# Patient Record
Sex: Male | Born: 1955 | Race: White | Hispanic: No | Marital: Married | Smoking: Never smoker
Health system: Southern US, Community
[De-identification: ages and names within clinical notes are randomized; demographics above are authoritative.]

## PROBLEM LIST (undated history)

## (undated) DIAGNOSIS — E785 Hyperlipidemia, unspecified: Secondary | ICD-10-CM

## (undated) DIAGNOSIS — S42409A Unspecified fracture of lower end of unspecified humerus, initial encounter for closed fracture: Secondary | ICD-10-CM

## (undated) HISTORY — DX: Hyperlipidemia, unspecified: E78.5

## (undated) HISTORY — PX: TONSILLECTOMY: SUR1361

---

## 2005-03-07 ENCOUNTER — Encounter: Admission: RE | Admit: 2005-03-07 | Discharge: 2005-03-07 | Payer: Self-pay | Admitting: Internal Medicine

## 2005-04-23 ENCOUNTER — Encounter (INDEPENDENT_AMBULATORY_CARE_PROVIDER_SITE_OTHER): Payer: Self-pay | Admitting: Specialist

## 2005-04-23 ENCOUNTER — Ambulatory Visit (HOSPITAL_COMMUNITY): Admission: RE | Admit: 2005-04-23 | Discharge: 2005-04-23 | Payer: Self-pay | Admitting: *Deleted

## 2009-10-23 ENCOUNTER — Ambulatory Visit: Payer: Self-pay | Admitting: Hematology and Oncology

## 2009-10-24 ENCOUNTER — Ambulatory Visit (HOSPITAL_COMMUNITY): Admission: RE | Admit: 2009-10-24 | Discharge: 2009-10-24 | Payer: Self-pay | Admitting: Hematology and Oncology

## 2009-10-24 LAB — CBC & DIFF AND RETIC
BASO%: 0.5 % (ref 0.0–2.0)
Basophils Absolute: 0 10*3/uL (ref 0.0–0.1)
EOS%: 1.2 % (ref 0.0–7.0)
Eosinophils Absolute: 0.1 10*3/uL (ref 0.0–0.5)
HCT: 19.7 % — ABNORMAL LOW (ref 38.4–49.9)
HGB: 7 g/dL — ABNORMAL LOW (ref 13.0–17.1)
Immature Retic Fract: 6.9 % (ref 0.00–13.40)
LYMPH%: 32.8 % (ref 14.0–49.0)
MCH: 34.3 pg — ABNORMAL HIGH (ref 27.2–33.4)
MCHC: 35.5 g/dL (ref 32.0–36.0)
MCV: 96.6 fL (ref 79.3–98.0)
MONO#: 0.4 10*3/uL (ref 0.1–0.9)
MONO%: 7.1 % (ref 0.0–14.0)
NEUT#: 3.5 10*3/uL (ref 1.5–6.5)
NEUT%: 58.4 % (ref 39.0–75.0)
Platelets: 300 10*3/uL (ref 140–400)
RBC: 2.04 10*6/uL — ABNORMAL LOW (ref 4.20–5.82)
RDW: 18.9 % — ABNORMAL HIGH (ref 11.0–14.6)
Retic %: 2.16 % — ABNORMAL HIGH (ref 0.50–1.60)
Retic Ct Abs: 44.06 10*3/uL (ref 24.10–77.50)
WBC: 6.1 10*3/uL (ref 4.0–10.3)
lymph#: 2 10*3/uL (ref 0.9–3.3)

## 2009-10-24 LAB — COMPREHENSIVE METABOLIC PANEL
ALT: 24 U/L (ref 0–53)
AST: 25 U/L (ref 0–37)
Albumin: 3.8 g/dL (ref 3.5–5.2)
Alkaline Phosphatase: 59 U/L (ref 39–117)
BUN: 12 mg/dL (ref 6–23)
CO2: 28 mEq/L (ref 19–32)
Calcium: 9.2 mg/dL (ref 8.4–10.5)
Chloride: 106 mEq/L (ref 96–112)
Creatinine, Ser: 0.92 mg/dL (ref 0.40–1.50)
Glucose, Bld: 128 mg/dL — ABNORMAL HIGH (ref 70–99)
Potassium: 4.2 mEq/L (ref 3.5–5.3)
Sodium: 139 mEq/L (ref 135–145)
Total Bilirubin: 1.2 mg/dL (ref 0.3–1.2)
Total Protein: 6.8 g/dL (ref 6.0–8.3)

## 2009-10-24 LAB — URINALYSIS, MICROSCOPIC - CHCC
Bilirubin (Urine): NEGATIVE
Glucose: NEGATIVE g/dL
Ketones: NEGATIVE mg/dL
Leukocyte Esterase: NEGATIVE
Nitrite: NEGATIVE
Protein: NEGATIVE mg/dL
Specific Gravity, Urine: 1.01 (ref 1.003–1.035)
WBC, UA: NEGATIVE (ref 0–2)
pH: 6.5 (ref 4.6–8.0)

## 2009-10-24 LAB — MORPHOLOGY: PLT EST: ADEQUATE

## 2009-10-24 LAB — LACTATE DEHYDROGENASE: LDH: 149 U/L (ref 94–250)

## 2009-10-25 ENCOUNTER — Other Ambulatory Visit: Admission: RE | Admit: 2009-10-25 | Discharge: 2009-10-25 | Payer: Self-pay | Admitting: Hematology and Oncology

## 2009-10-25 LAB — TYPE & CROSSMATCH - CHCC

## 2009-10-25 LAB — HIV ANTIBODY (ROUTINE TESTING W REFLEX)

## 2009-10-27 LAB — IRON AND TIBC
Iron: 238 ug/dL — ABNORMAL HIGH (ref 42–165)
UIBC: <55 ug/dL

## 2009-10-27 LAB — PROTEIN ELECTROPHORESIS, SERUM, WITH REFLEX
Albumin ELP: 60.8 % (ref 55.8–66.1)
Alpha-1-Globulin: 7.5 % — ABNORMAL HIGH (ref 2.9–4.9)
Alpha-2-Globulin: 9.3 % (ref 7.1–11.8)
Beta 2: 3.4 % (ref 3.2–6.5)
Beta Globulin: 5.3 % (ref 4.7–7.2)
Gamma Globulin: 13.7 % (ref 11.1–18.8)
M-Spike, %: 0.48 g/dL
Total Protein, Serum Electrophoresis: 6.5 g/dL (ref 6.0–8.3)

## 2009-10-27 LAB — FERRITIN: Ferritin: 237 ng/mL (ref 22–322)

## 2009-10-27 LAB — DIRECT ANTIGLOBULIN TEST (NOT AT ARMC)
DAT (Complement): NEGATIVE
DAT IgG: NEGATIVE

## 2009-10-27 LAB — IGG, IGA, IGM
IgA: 112 mg/dL (ref 68–378)
IgG (Immunoglobin G), Serum: 891 mg/dL (ref 694–1618)
IgM, Serum: 132 mg/dL (ref 60–263)

## 2009-10-27 LAB — IFE INTERPRETATION

## 2009-10-27 LAB — HAPTOGLOBIN: Haptoglobin: 135 mg/dL (ref 16–200)

## 2009-10-31 ENCOUNTER — Ambulatory Visit (HOSPITAL_COMMUNITY): Admission: RE | Admit: 2009-10-31 | Discharge: 2009-10-31 | Payer: Self-pay | Admitting: Hematology and Oncology

## 2009-11-01 LAB — CBC WITH DIFFERENTIAL/PLATELET
BASO%: 0.6 % (ref 0.0–2.0)
Basophils Absolute: 0 10*3/uL (ref 0.0–0.1)
EOS%: 0.7 % (ref 0.0–7.0)
Eosinophils Absolute: 0 10*3/uL (ref 0.0–0.5)
HCT: 24.6 % — ABNORMAL LOW (ref 38.4–49.9)
HGB: 8.8 g/dL — ABNORMAL LOW (ref 13.0–17.1)
LYMPH%: 38 % (ref 14.0–49.0)
MCH: 34 pg — ABNORMAL HIGH (ref 27.2–33.4)
MCHC: 35.7 g/dL (ref 32.0–36.0)
MCV: 95.3 fL (ref 79.3–98.0)
MONO#: 0.4 10*3/uL (ref 0.1–0.9)
MONO%: 8.2 % (ref 0.0–14.0)
NEUT#: 2.8 10*3/uL (ref 1.5–6.5)
NEUT%: 52.5 % (ref 39.0–75.0)
Platelets: 240 10*3/uL (ref 140–400)
RBC: 2.58 10*6/uL — ABNORMAL LOW (ref 4.20–5.82)
RDW: 21.9 % — ABNORMAL HIGH (ref 11.0–14.6)
WBC: 5.3 10*3/uL (ref 4.0–10.3)
lymph#: 2 10*3/uL (ref 0.9–3.3)

## 2009-11-08 LAB — CBC WITH DIFFERENTIAL/PLATELET
BASO%: 0.8 % (ref 0.0–2.0)
Basophils Absolute: 0.1 10*3/uL (ref 0.0–0.1)
EOS%: 0.5 % (ref 0.0–7.0)
Eosinophils Absolute: 0 10*3/uL (ref 0.0–0.5)
HCT: 23 % — ABNORMAL LOW (ref 38.4–49.9)
HGB: 8.2 g/dL — ABNORMAL LOW (ref 13.0–17.1)
LYMPH%: 35.1 % (ref 14.0–49.0)
MCH: 33.7 pg — ABNORMAL HIGH (ref 27.2–33.4)
MCHC: 35.5 g/dL (ref 32.0–36.0)
MCV: 94.9 fL (ref 79.3–98.0)
MONO#: 0.5 10*3/uL (ref 0.1–0.9)
MONO%: 7.5 % (ref 0.0–14.0)
NEUT#: 3.7 10*3/uL (ref 1.5–6.5)
NEUT%: 56.1 % (ref 39.0–75.0)
Platelets: 323 10*3/uL (ref 140–400)
RBC: 2.43 10*6/uL — ABNORMAL LOW (ref 4.20–5.82)
RDW: 21.9 % — ABNORMAL HIGH (ref 11.0–14.6)
WBC: 6.5 10*3/uL (ref 4.0–10.3)
lymph#: 2.3 10*3/uL (ref 0.9–3.3)

## 2009-11-09 ENCOUNTER — Encounter (HOSPITAL_COMMUNITY): Admission: RE | Admit: 2009-11-09 | Discharge: 2010-01-29 | Payer: Self-pay | Admitting: Hematology and Oncology

## 2009-11-09 LAB — TYPE & CROSSMATCH - CHCC

## 2009-11-19 LAB — CBC WITH DIFFERENTIAL/PLATELET
BASO%: 0.6 % (ref 0.0–2.0)
Basophils Absolute: 0 10*3/uL (ref 0.0–0.1)
EOS%: 0.9 % (ref 0.0–7.0)
Eosinophils Absolute: 0 10*3/uL (ref 0.0–0.5)
HCT: 25.5 % — ABNORMAL LOW (ref 38.4–49.9)
HGB: 8.9 g/dL — ABNORMAL LOW (ref 13.0–17.1)
LYMPH%: 35.2 % (ref 14.0–49.0)
MCH: 32.5 pg (ref 27.2–33.4)
MCHC: 35.1 g/dL (ref 32.0–36.0)
MCV: 92.5 fL (ref 79.3–98.0)
MONO#: 0.5 10*3/uL (ref 0.1–0.9)
MONO%: 9.2 % (ref 0.0–14.0)
NEUT#: 2.8 10*3/uL (ref 1.5–6.5)
NEUT%: 54.1 % (ref 39.0–75.0)
Platelets: 260 10*3/uL (ref 140–400)
RBC: 2.75 10*6/uL — ABNORMAL LOW (ref 4.20–5.82)
RDW: 19.2 % — ABNORMAL HIGH (ref 11.0–14.6)
WBC: 5.3 10*3/uL (ref 4.0–10.3)
lymph#: 1.8 10*3/uL (ref 0.9–3.3)

## 2009-11-19 LAB — COMPREHENSIVE METABOLIC PANEL
ALT: 26 U/L (ref 0–53)
AST: 22 U/L (ref 0–37)
Albumin: 4.2 g/dL (ref 3.5–5.2)
Alkaline Phosphatase: 67 U/L (ref 39–117)
BUN: 13 mg/dL (ref 6–23)
CO2: 25 mEq/L (ref 19–32)
Calcium: 9.1 mg/dL (ref 8.4–10.5)
Chloride: 106 mEq/L (ref 96–112)
Creatinine, Ser: 0.95 mg/dL (ref 0.40–1.50)
Glucose, Bld: 97 mg/dL (ref 70–99)
Potassium: 4.5 mEq/L (ref 3.5–5.3)
Sodium: 140 mEq/L (ref 135–145)
Total Bilirubin: 0.7 mg/dL (ref 0.3–1.2)
Total Protein: 6.7 g/dL (ref 6.0–8.3)

## 2009-11-20 LAB — TYPE & CROSSMATCH - CHCC

## 2009-11-23 ENCOUNTER — Ambulatory Visit: Payer: Self-pay | Admitting: Hematology and Oncology

## 2009-11-23 LAB — CBC WITH DIFFERENTIAL/PLATELET
BASO%: 0.8 % (ref 0.0–2.0)
Basophils Absolute: 0 10*3/uL (ref 0.0–0.1)
EOS%: 5.4 % (ref 0.0–7.0)
Eosinophils Absolute: 0.2 10*3/uL (ref 0.0–0.5)
HCT: 32 % — ABNORMAL LOW (ref 38.4–49.9)
HGB: 11.2 g/dL — ABNORMAL LOW (ref 13.0–17.1)
LYMPH%: 32.6 % (ref 14.0–49.0)
MCH: 30.9 pg (ref 27.2–33.4)
MCHC: 35 g/dL (ref 32.0–36.0)
MCV: 88.4 fL (ref 79.3–98.0)
MONO#: 0.9 10*3/uL (ref 0.1–0.9)
MONO%: 23.7 % — ABNORMAL HIGH (ref 0.0–14.0)
NEUT#: 1.4 10*3/uL — ABNORMAL LOW (ref 1.5–6.5)
NEUT%: 37.5 % — ABNORMAL LOW (ref 39.0–75.0)
Platelets: 120 10*3/uL — ABNORMAL LOW (ref 140–400)
RBC: 3.62 10*6/uL — ABNORMAL LOW (ref 4.20–5.82)
RDW: 16.7 % — ABNORMAL HIGH (ref 11.0–14.6)
WBC: 3.7 10*3/uL — ABNORMAL LOW (ref 4.0–10.3)
lymph#: 1.2 10*3/uL (ref 0.9–3.3)
nRBC: 0 % (ref 0–0)

## 2009-11-23 LAB — BASIC METABOLIC PANEL
BUN: 16 mg/dL (ref 6–23)
CO2: 28 mEq/L (ref 19–32)
Calcium: 8.9 mg/dL (ref 8.4–10.5)
Chloride: 105 mEq/L (ref 96–112)
Creatinine, Ser: 1 mg/dL (ref 0.40–1.50)
Glucose, Bld: 84 mg/dL (ref 70–99)
Potassium: 4.1 mEq/L (ref 3.5–5.3)
Sodium: 138 mEq/L (ref 135–145)

## 2009-11-23 LAB — LACTATE DEHYDROGENASE: LDH: 180 U/L (ref 94–250)

## 2009-11-27 LAB — CBC WITH DIFFERENTIAL/PLATELET
BASO%: 0.2 % (ref 0.0–2.0)
Basophils Absolute: 0 10*3/uL (ref 0.0–0.1)
EOS%: 2 % (ref 0.0–7.0)
Eosinophils Absolute: 0.1 10*3/uL (ref 0.0–0.5)
HCT: 27.4 % — ABNORMAL LOW (ref 38.4–49.9)
HGB: 10 g/dL — ABNORMAL LOW (ref 13.0–17.1)
LYMPH%: 23.7 % (ref 14.0–49.0)
MCH: 32.6 pg (ref 27.2–33.4)
MCHC: 36.4 g/dL — ABNORMAL HIGH (ref 32.0–36.0)
MCV: 89.6 fL (ref 79.3–98.0)
MONO#: 0.3 10*3/uL (ref 0.1–0.9)
MONO%: 6.8 % (ref 0.0–14.0)
NEUT#: 2.8 10*3/uL (ref 1.5–6.5)
NEUT%: 67.3 % (ref 39.0–75.0)
Platelets: 157 10*3/uL (ref 140–400)
RBC: 3.06 10*6/uL — ABNORMAL LOW (ref 4.20–5.82)
RDW: 18.1 % — ABNORMAL HIGH (ref 11.0–14.6)
WBC: 4.1 10*3/uL (ref 4.0–10.3)
lymph#: 1 10*3/uL (ref 0.9–3.3)

## 2009-11-27 LAB — COMPREHENSIVE METABOLIC PANEL
ALT: 63 U/L — ABNORMAL HIGH (ref 0–53)
AST: 38 U/L — ABNORMAL HIGH (ref 0–37)
Albumin: 4 g/dL (ref 3.5–5.2)
Alkaline Phosphatase: 76 U/L (ref 39–117)
BUN: 16 mg/dL (ref 6–23)
CO2: 22 mEq/L (ref 19–32)
Calcium: 8.4 mg/dL (ref 8.4–10.5)
Chloride: 104 mEq/L (ref 96–112)
Creatinine, Ser: 1.02 mg/dL (ref 0.40–1.50)
Glucose, Bld: 120 mg/dL — ABNORMAL HIGH (ref 70–99)
Potassium: 4.2 mEq/L (ref 3.5–5.3)
Sodium: 138 mEq/L (ref 135–145)
Total Bilirubin: 0.6 mg/dL (ref 0.3–1.2)
Total Protein: 6.4 g/dL (ref 6.0–8.3)

## 2009-12-10 LAB — CBC WITH DIFFERENTIAL/PLATELET
BASO%: 0.4 % (ref 0.0–2.0)
Basophils Absolute: 0 10*3/uL (ref 0.0–0.1)
EOS%: 0.4 % (ref 0.0–7.0)
Eosinophils Absolute: 0 10*3/uL (ref 0.0–0.5)
HCT: 26.2 % — ABNORMAL LOW (ref 38.4–49.9)
HGB: 9.1 g/dL — ABNORMAL LOW (ref 13.0–17.1)
LYMPH%: 21.5 % (ref 14.0–49.0)
MCH: 31.4 pg (ref 27.2–33.4)
MCHC: 34.6 g/dL (ref 32.0–36.0)
MCV: 90.8 fL (ref 79.3–98.0)
MONO#: 0.6 10*3/uL (ref 0.1–0.9)
MONO%: 6.1 % (ref 0.0–14.0)
NEUT#: 6.5 10*3/uL (ref 1.5–6.5)
NEUT%: 71.6 % (ref 39.0–75.0)
Platelets: 400 10*3/uL (ref 140–400)
RBC: 2.88 10*6/uL — ABNORMAL LOW (ref 4.20–5.82)
RDW: 18.9 % — ABNORMAL HIGH (ref 11.0–14.6)
WBC: 9.1 10*3/uL (ref 4.0–10.3)
lymph#: 2 10*3/uL (ref 0.9–3.3)

## 2009-12-11 LAB — HOLD TUBE, BLOOD BANK

## 2009-12-12 LAB — BASIC METABOLIC PANEL
BUN: 15 mg/dL (ref 6–23)
CO2: 23 mEq/L (ref 19–32)
Calcium: 9.4 mg/dL (ref 8.4–10.5)
Chloride: 106 mEq/L (ref 96–112)
Creatinine, Ser: 0.97 mg/dL (ref 0.40–1.50)
Glucose, Bld: 106 mg/dL — ABNORMAL HIGH (ref 70–99)
Potassium: 4.8 mEq/L (ref 3.5–5.3)
Sodium: 139 mEq/L (ref 135–145)

## 2009-12-12 LAB — CBC WITH DIFFERENTIAL/PLATELET
BASO%: 1.1 % (ref 0.0–2.0)
Basophils Absolute: 0.1 10*3/uL (ref 0.0–0.1)
EOS%: 0.3 % (ref 0.0–7.0)
Eosinophils Absolute: 0 10*3/uL (ref 0.0–0.5)
HCT: 27.7 % — ABNORMAL LOW (ref 38.4–49.9)
HGB: 9.5 g/dL — ABNORMAL LOW (ref 13.0–17.1)
LYMPH%: 25.5 % (ref 14.0–49.0)
MCH: 31.6 pg (ref 27.2–33.4)
MCHC: 34.3 g/dL (ref 32.0–36.0)
MCV: 92.1 fL (ref 79.3–98.0)
MONO#: 0.3 10*3/uL (ref 0.1–0.9)
MONO%: 3.4 % (ref 0.0–14.0)
NEUT#: 5.6 10*3/uL (ref 1.5–6.5)
NEUT%: 69.7 % (ref 39.0–75.0)
Platelets: 308 10*3/uL (ref 140–400)
RBC: 3 10*6/uL — ABNORMAL LOW (ref 4.20–5.82)
RDW: 19.8 % — ABNORMAL HIGH (ref 11.0–14.6)
WBC: 8.1 10*3/uL (ref 4.0–10.3)
lymph#: 2.1 10*3/uL (ref 0.9–3.3)

## 2010-03-29 ENCOUNTER — Ambulatory Visit: Payer: Self-pay | Admitting: Hematology and Oncology

## 2010-03-29 LAB — CBC WITH DIFFERENTIAL/PLATELET
BASO%: 1 % (ref 0.0–2.0)
Basophils Absolute: 0.1 10*3/uL (ref 0.0–0.1)
EOS%: 1.9 % (ref 0.0–7.0)
Eosinophils Absolute: 0.1 10*3/uL (ref 0.0–0.5)
HCT: 23 % — ABNORMAL LOW (ref 38.4–49.9)
HGB: 8.3 g/dL — ABNORMAL LOW (ref 13.0–17.1)
LYMPH%: 37.5 % (ref 14.0–49.0)
MCH: 33.2 pg (ref 27.2–33.4)
MCHC: 36.3 g/dL — ABNORMAL HIGH (ref 32.0–36.0)
MCV: 91.6 fL (ref 79.3–98.0)
MONO#: 0.5 10*3/uL (ref 0.1–0.9)
MONO%: 7.8 % (ref 0.0–14.0)
NEUT#: 3.3 10*3/uL (ref 1.5–6.5)
NEUT%: 51.8 % (ref 39.0–75.0)
Platelets: 237 10*3/uL (ref 140–400)
RBC: 2.51 10*6/uL — ABNORMAL LOW (ref 4.20–5.82)
RDW: 16.5 % — ABNORMAL HIGH (ref 11.0–14.6)
WBC: 6.4 10*3/uL (ref 4.0–10.3)
lymph#: 2.4 10*3/uL (ref 0.9–3.3)

## 2010-07-19 LAB — SAMPLE TO BLOOD BANK

## 2010-07-20 LAB — CROSSMATCH
ABO/RH(D): A POS
Antibody Screen: NEGATIVE

## 2010-07-21 LAB — CROSSMATCH
ABO/RH(D): A POS
ABO/RH(D): A POS
Antibody Screen: NEGATIVE
Antibody Screen: NEGATIVE

## 2010-07-21 LAB — BONE MARROW EXAM: Bone Marrow Exam: 467

## 2010-07-21 LAB — ABO/RH: ABO/RH(D): A POS

## 2010-09-20 NOTE — Op Note (Signed)
NAMECHRISTOPHR, Raymond Ferguson              ACCOUNT NO.:  [id number]   MEDICAL RECORD NO.:  [id number]          PATIENT TYPE:  AMB   LOCATION:  ENDO                         FACILITY:  [facility]   PHYSICIAN:  Georgiana Spinner, M.D.    DATE OF BIRTH:  May 23, 1955   DATE OF PROCEDURE:  04/23/2005  DATE OF DISCHARGE:                                 OPERATIVE REPORT   PROCEDURE:  Colonoscopy with polypectomy.   INDICATIONS:  Colon polyps.   ANESTHESIA:  Demerol 80, Versed 8 mg.   DESCRIPTION OF PROCEDURE:  With the patient mildly sedated in the left  lateral decubitus position,  a rectal exam was performed which was  unremarkable. Subsequently the Olympus videoscopic colonoscope was inserted  in the rectum and passed under direct vision to the cecum identified by the  ileocecal valve and appendiceal orifice both of which were photographed.  From this point, the colonoscope was slowly withdrawn taking circumferential  views of the colonic mucosa stopping only at 20 cm from the anal verge at  which point a polyp was seen, photographed, and removed using snare cautery  technique setting of 20/200 blended current. The polyp was retrieved for  pathology. The endoscope was placed in retroflexion to view the anal canal  from above. Internal hemorrhoids were seen and photographed. The endoscope  was straightened and withdrawn. The patient's vital signs and pulse oximeter  remained stable. The patient tolerated the procedure well without apparent  complications.   FINDINGS:  Polyp of rectum at 20 cm from the anal verge. Internal  hemorrhoids,  otherwise an unremarkable colonoscopic examination to the  cecum.   PLAN:  Await biopsy report. The patient will call me for results and follow-  up with me as an outpatient.           ______________________________  Georgiana Spinner, M.D.     GMO/MEDQ  D:  04/23/2005  T:  04/25/2005  Job:  [id number]

## 2016-02-01 ENCOUNTER — Encounter (HOSPITAL_COMMUNITY): Payer: Self-pay | Admitting: *Deleted

## 2016-02-01 ENCOUNTER — Emergency Department (HOSPITAL_COMMUNITY): Payer: BLUE CROSS/BLUE SHIELD

## 2016-02-01 ENCOUNTER — Emergency Department (HOSPITAL_COMMUNITY)
Admission: EM | Admit: 2016-02-01 | Discharge: 2016-02-01 | Disposition: A | Discharge status: Home / Self Care | Payer: BLUE CROSS/BLUE SHIELD | Attending: Emergency Medicine | Admitting: Emergency Medicine

## 2016-02-01 DIAGNOSIS — S52022A Displaced fracture of olecranon process without intraarticular extension of left ulna, initial encounter for closed fracture: Secondary | ICD-10-CM | POA: Diagnosis not present

## 2016-02-01 DIAGNOSIS — S42402A Unspecified fracture of lower end of left humerus, initial encounter for closed fracture: Secondary | ICD-10-CM

## 2016-02-01 DIAGNOSIS — Y92410 Unspecified street and highway as the place of occurrence of the external cause: Secondary | ICD-10-CM | POA: Insufficient documentation

## 2016-02-01 DIAGNOSIS — Y9355 Activity, bike riding: Secondary | ICD-10-CM | POA: Diagnosis not present

## 2016-02-01 DIAGNOSIS — Y999 Unspecified external cause status: Secondary | ICD-10-CM | POA: Diagnosis not present

## 2016-02-01 DIAGNOSIS — Z23 Encounter for immunization: Secondary | ICD-10-CM | POA: Diagnosis not present

## 2016-02-01 DIAGNOSIS — S0181XA Laceration without foreign body of other part of head, initial encounter: Secondary | ICD-10-CM | POA: Insufficient documentation

## 2016-02-01 DIAGNOSIS — Z79899 Other long term (current) drug therapy: Secondary | ICD-10-CM | POA: Insufficient documentation

## 2016-02-01 DIAGNOSIS — S59902A Unspecified injury of left elbow, initial encounter: Secondary | ICD-10-CM | POA: Diagnosis present

## 2016-02-01 MED ORDER — HYDROCODONE-ACETAMINOPHEN 5-325 MG PO TABS: 1.0000 | ORAL_TABLET | Freq: Four times a day (QID) | ORAL | 0 refills | Status: DC | PRN

## 2016-02-01 MED ORDER — TETANUS-DIPHTH-ACELL PERTUSSIS 5-2.5-18.5 LF-MCG/0.5 IM SUSP
0.5000 mL | Freq: Once | INTRAMUSCULAR | Status: AC
  Administered 2016-02-01: 0.5 mL via INTRAMUSCULAR
  Filled 2016-02-01: qty 0.5

## 2016-02-01 MED ORDER — LIDOCAINE-EPINEPHRINE (PF) 2 %-1:200000 IJ SOLN
20.0000 mL | Freq: Once | INTRAMUSCULAR | Status: DC
  Filled 2016-02-01: qty 20

## 2016-02-01 MED ORDER — BACITRACIN ZINC 500 UNIT/GM EX OINT
TOPICAL_OINTMENT | CUTANEOUS | Status: AC
  Filled 2016-02-01: qty 0.9

## 2016-02-01 NOTE — ED Notes (Signed)
Patient verbalizes understanding of discharge instructions, home care and follow up care, patient out of department at this time
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
## 2016-02-01 NOTE — Discharge Instructions (Signed)
Call Dr. Fredna Dow office on Monday to set appointment time Return if you develop numbness/tingling in fingers  Facial sutures should be removed in 5 days this can be done by your PCP or Urgent care

## 2016-02-05 ENCOUNTER — Other Ambulatory Visit: Payer: Self-pay | Admitting: Orthopedic Surgery

## 2016-02-05 ENCOUNTER — Encounter (HOSPITAL_BASED_OUTPATIENT_CLINIC_OR_DEPARTMENT_OTHER): Payer: Self-pay | Admitting: *Deleted

## 2016-02-12 ENCOUNTER — Ambulatory Visit (HOSPITAL_BASED_OUTPATIENT_CLINIC_OR_DEPARTMENT_OTHER): Payer: BLUE CROSS/BLUE SHIELD | Admitting: Anesthesiology

## 2016-02-12 ENCOUNTER — Encounter (HOSPITAL_BASED_OUTPATIENT_CLINIC_OR_DEPARTMENT_OTHER)
Admission: RE | Disposition: A | Discharge status: Home / Self Care | Payer: Self-pay | Source: Ambulatory Visit | Attending: Orthopedic Surgery

## 2016-02-12 ENCOUNTER — Encounter (HOSPITAL_BASED_OUTPATIENT_CLINIC_OR_DEPARTMENT_OTHER): Payer: Self-pay | Admitting: Anesthesiology

## 2016-02-12 ENCOUNTER — Ambulatory Visit (HOSPITAL_BASED_OUTPATIENT_CLINIC_OR_DEPARTMENT_OTHER)
Admission: RE | Admit: 2016-02-12 | Discharge: 2016-02-12 | Disposition: A | Discharge status: Home / Self Care | Payer: BLUE CROSS/BLUE SHIELD | Source: Ambulatory Visit | Attending: Orthopedic Surgery | Admitting: Orthopedic Surgery

## 2016-02-12 DIAGNOSIS — S52022A Displaced fracture of olecranon process without intraarticular extension of left ulna, initial encounter for closed fracture: Secondary | ICD-10-CM | POA: Insufficient documentation

## 2016-02-12 HISTORY — PX: ORIF ELBOW FRACTURE: SHX5031

## 2016-02-12 HISTORY — DX: Unspecified fracture of lower end of unspecified humerus, initial encounter for closed fracture: S42.409A

## 2016-02-12 SURGERY — OPEN REDUCTION INTERNAL FIXATION (ORIF) ELBOW/OLECRANON FRACTURE
Anesthesia: Regional | Site: Elbow | Laterality: Left

## 2016-02-12 MED ORDER — CEFAZOLIN SODIUM-DEXTROSE 2-4 GM/100ML-% IV SOLN
2.0000 g | INTRAVENOUS | Status: AC
  Administered 2016-02-12: 2 g via INTRAVENOUS

## 2016-02-12 MED ORDER — ONDANSETRON HCL 4 MG/2ML IJ SOLN
INTRAMUSCULAR | Status: AC
  Filled 2016-02-12: qty 2

## 2016-02-12 MED ORDER — LIDOCAINE 2% (20 MG/ML) 5 ML SYRINGE
INTRAMUSCULAR | Status: AC
Start: 2016-02-12 — End: 2016-02-12
  Filled 2016-02-12: qty 5

## 2016-02-12 MED ORDER — CEFAZOLIN SODIUM-DEXTROSE 2-4 GM/100ML-% IV SOLN
INTRAVENOUS | Status: AC
  Filled 2016-02-12: qty 100

## 2016-02-12 MED ORDER — CHLORHEXIDINE GLUCONATE 4 % EX LIQD: 60.0000 mL | Freq: Once | CUTANEOUS | Status: DC

## 2016-02-12 MED ORDER — SCOPOLAMINE 1 MG/3DAYS TD PT72: 1.0000 | MEDICATED_PATCH | Freq: Once | TRANSDERMAL | Status: DC | PRN

## 2016-02-12 MED ORDER — FENTANYL CITRATE (PF) 100 MCG/2ML IJ SOLN
INTRAMUSCULAR | Status: AC
  Filled 2016-02-12: qty 2

## 2016-02-12 MED ORDER — PROPOFOL 10 MG/ML IV BOLUS
INTRAVENOUS | Status: AC
  Filled 2016-02-12: qty 20

## 2016-02-12 MED ORDER — 0.9 % SODIUM CHLORIDE (POUR BTL) OPTIME
TOPICAL | Status: DC | PRN
  Administered 2016-02-12: 300 mL

## 2016-02-12 MED ORDER — ONDANSETRON HCL 4 MG/2ML IJ SOLN
INTRAMUSCULAR | Status: DC | PRN
  Administered 2016-02-12: 4 mg via INTRAVENOUS

## 2016-02-12 MED ORDER — DEXAMETHASONE SODIUM PHOSPHATE 10 MG/ML IJ SOLN
INTRAMUSCULAR | Status: AC
  Filled 2016-02-12: qty 1

## 2016-02-12 MED ORDER — FENTANYL CITRATE (PF) 100 MCG/2ML IJ SOLN
50.0000 ug | INTRAMUSCULAR | Status: DC | PRN
  Administered 2016-02-12: 50 ug via INTRAVENOUS

## 2016-02-12 MED ORDER — MIDAZOLAM HCL 5 MG/5ML IJ SOLN
INTRAMUSCULAR | Status: DC | PRN
  Administered 2016-02-12: 2 mg via INTRAVENOUS

## 2016-02-12 MED ORDER — HYDROCODONE-ACETAMINOPHEN 7.5-325 MG PO TABS: 1.0000 | ORAL_TABLET | Freq: Once | ORAL | Status: DC | PRN

## 2016-02-12 MED ORDER — MIDAZOLAM HCL 2 MG/2ML IJ SOLN
INTRAMUSCULAR | Status: AC
  Filled 2016-02-12: qty 2

## 2016-02-12 MED ORDER — HYDROMORPHONE HCL 1 MG/ML IJ SOLN: 0.2500 mg | INTRAMUSCULAR | Status: DC | PRN

## 2016-02-12 MED ORDER — FENTANYL CITRATE (PF) 100 MCG/2ML IJ SOLN
INTRAMUSCULAR | Status: DC | PRN
  Administered 2016-02-12 (×2): 50 ug via INTRAVENOUS
  Administered 2016-02-12: 100 ug via INTRAVENOUS
  Administered 2016-02-12 (×2): 50 ug via INTRAVENOUS

## 2016-02-12 MED ORDER — LIDOCAINE HCL (CARDIAC) 20 MG/ML IV SOLN
INTRAVENOUS | Status: DC | PRN
  Administered 2016-02-12: 30 mg via INTRAVENOUS

## 2016-02-12 MED ORDER — LACTATED RINGERS IV SOLN
INTRAVENOUS | Status: DC
  Administered 2016-02-12 (×3): via INTRAVENOUS

## 2016-02-12 MED ORDER — PROMETHAZINE HCL 25 MG/ML IJ SOLN: 6.2500 mg | INTRAMUSCULAR | Status: DC | PRN

## 2016-02-12 MED ORDER — GLYCOPYRROLATE 0.2 MG/ML IJ SOLN: 0.2000 mg | Freq: Once | INTRAMUSCULAR | Status: DC | PRN

## 2016-02-12 MED ORDER — MIDAZOLAM HCL 2 MG/2ML IJ SOLN
1.0000 mg | INTRAMUSCULAR | Status: DC | PRN
  Administered 2016-02-12: 2 mg via INTRAVENOUS

## 2016-02-12 MED ORDER — BUPIVACAINE-EPINEPHRINE (PF) 0.5% -1:200000 IJ SOLN
INTRAMUSCULAR | Status: DC | PRN
  Administered 2016-02-12: 30 mL via PERINEURAL

## 2016-02-12 MED ORDER — OXYCODONE-ACETAMINOPHEN 5-325 MG PO TABS: ORAL_TABLET | ORAL | 0 refills | Status: DC

## 2016-02-12 MED ORDER — PROPOFOL 10 MG/ML IV BOLUS
INTRAVENOUS | Status: DC | PRN
  Administered 2016-02-12: 150 mg via INTRAVENOUS
  Administered 2016-02-12: 50 mg via INTRAVENOUS

## 2016-02-12 MED ORDER — DEXAMETHASONE SODIUM PHOSPHATE 10 MG/ML IJ SOLN
INTRAMUSCULAR | Status: DC | PRN
  Administered 2016-02-12: 10 mg via INTRAVENOUS

## 2016-02-12 MED ORDER — SULFAMETHOXAZOLE-TRIMETHOPRIM 800-160 MG PO TABS: 1.0000 | ORAL_TABLET | Freq: Two times a day (BID) | ORAL | 0 refills | Status: DC

## 2016-02-12 SURGICAL SUPPLY — 70 items
70 MM TENSION BAND PIN ×6 IMPLANT
BANDAGE ACE 3X5.8 VEL STRL LF (GAUZE/BANDAGES/DRESSINGS) ×6 IMPLANT
BENZOIN TINCTURE PRP APPL 2/3 (GAUZE/BANDAGES/DRESSINGS) IMPLANT
BLADE MINI RND TIP GREEN BEAV (BLADE) IMPLANT
BLADE SURG 15 STRL LF DISP TIS (BLADE) ×2 IMPLANT
BLADE SURG 15 STRL SS (BLADE) ×4
BNDG ESMARK 4X9 LF (GAUZE/BANDAGES/DRESSINGS) ×3 IMPLANT
BNDG GAUZE ELAST 4 BULKY (GAUZE/BANDAGES/DRESSINGS) ×3 IMPLANT
CHLORAPREP W/TINT 26ML (MISCELLANEOUS) ×3 IMPLANT
CLOSURE WOUND 1/2 X4 (GAUZE/BANDAGES/DRESSINGS)
CORDS BIPOLAR (ELECTRODE) ×3 IMPLANT
COVER BACK TABLE 60X90IN (DRAPES) ×3 IMPLANT
COVER MAYO STAND STRL (DRAPES) ×3 IMPLANT
CUFF TOURN SGL LL 18 NRW (TOURNIQUET CUFF) ×3 IMPLANT
DRAPE EXTREMITY T 121X128X90 (DRAPE) ×3 IMPLANT
DRAPE OEC MINIVIEW 54X84 (DRAPES) ×3 IMPLANT
DRAPE SURG 17X23 STRL (DRAPES) ×3 IMPLANT
DRSG PAD ABDOMINAL 8X10 ST (GAUZE/BANDAGES/DRESSINGS) ×3 IMPLANT
DRSG TEGADERM 4X4.75 (GAUZE/BANDAGES/DRESSINGS) ×3 IMPLANT
GAUZE SPONGE 4X4 12PLY STRL (GAUZE/BANDAGES/DRESSINGS) ×3 IMPLANT
GAUZE XEROFORM 1X8 LF (GAUZE/BANDAGES/DRESSINGS) ×3 IMPLANT
GAUZE XEROFORM 5X9 LF (GAUZE/BANDAGES/DRESSINGS) ×3 IMPLANT
GLOVE BIO SURGEON STRL SZ7.5 (GLOVE) ×3 IMPLANT
GLOVE BIOGEL PI IND STRL 7.0 (GLOVE) ×2 IMPLANT
GLOVE BIOGEL PI IND STRL 8 (GLOVE) ×1 IMPLANT
GLOVE BIOGEL PI IND STRL 8.5 (GLOVE) ×1 IMPLANT
GLOVE BIOGEL PI INDICATOR 7.0 (GLOVE) ×4
GLOVE BIOGEL PI INDICATOR 8 (GLOVE) ×2
GLOVE BIOGEL PI INDICATOR 8.5 (GLOVE) ×2
GLOVE ECLIPSE 6.5 STRL STRAW (GLOVE) ×6 IMPLANT
GLOVE SURG ORTHO 8.0 STRL STRW (GLOVE) ×3 IMPLANT
GOWN STRL REUS W/ TWL LRG LVL3 (GOWN DISPOSABLE) ×1 IMPLANT
GOWN STRL REUS W/TWL LRG LVL3 (GOWN DISPOSABLE) ×2
GOWN STRL REUS W/TWL XL LVL3 (GOWN DISPOSABLE) ×6 IMPLANT
GUIDEWIRE ORTH 6X062XTROC NS (WIRE) ×1 IMPLANT
K-WIRE .062 (WIRE) ×2
NEEDLE HYPO 25X1 1.5 SAFETY (NEEDLE) IMPLANT
PACK BASIN DAY SURGERY FS (CUSTOM PROCEDURE TRAY) ×3 IMPLANT
PAD CAST 3X4 CTTN HI CHSV (CAST SUPPLIES) ×1 IMPLANT
PAD CAST 4YDX4 CTTN HI CHSV (CAST SUPPLIES) ×2 IMPLANT
PADDING CAST ABS 4INX4YD NS (CAST SUPPLIES) ×2
PADDING CAST ABS COTTON 4X4 ST (CAST SUPPLIES) ×1 IMPLANT
PADDING CAST COTTON 3X4 STRL (CAST SUPPLIES) ×2
PADDING CAST COTTON 4X4 STRL (CAST SUPPLIES) ×4
SLEEVE SCD COMPRESS KNEE MED (MISCELLANEOUS) ×3 IMPLANT
SLING ARM FOAM STRAP LRG (SOFTGOODS) ×3 IMPLANT
SPLINT FAST PLASTER 5X30 (CAST SUPPLIES) ×20
SPLINT PLASTER CAST FAST 5X30 (CAST SUPPLIES) ×10 IMPLANT
SPLINT PLASTER CAST XFAST 3X15 (CAST SUPPLIES) ×10 IMPLANT
SPLINT PLASTER XTRA FASTSET 3X (CAST SUPPLIES) ×20
SPONGE LAP 4X18 X RAY DECT (DISPOSABLE) ×3 IMPLANT
STAPLER VISISTAT (STAPLE) ×3 IMPLANT
STOCKINETTE 4X48 STRL (DRAPES) ×3 IMPLANT
STRIP CLOSURE SKIN 1/2X4 (GAUZE/BANDAGES/DRESSINGS) IMPLANT
SUCTION FRAZIER HANDLE 10FR (MISCELLANEOUS) ×2
SUCTION TUBE FRAZIER 10FR DISP (MISCELLANEOUS) ×1 IMPLANT
SUT ETHILON 3 0 PS 1 (SUTURE) IMPLANT
SUT ETHILON 4 0 PS 2 18 (SUTURE) IMPLANT
SUT MNCRL AB 4-0 PS2 18 (SUTURE) ×3 IMPLANT
SUT STEEL 7 (SUTURE) ×3 IMPLANT
SUT VIC AB 2-0 PS2 27 (SUTURE) ×3 IMPLANT
SUT VIC AB 3-0 PS1 18 (SUTURE)
SUT VIC AB 3-0 PS1 18XBRD (SUTURE) IMPLANT
SUT VICRYL 4-0 PS2 18IN ABS (SUTURE) ×3 IMPLANT
SYR BULB 3OZ (MISCELLANEOUS) ×3 IMPLANT
SYR CONTROL 10ML LL (SYRINGE) IMPLANT
TOWEL OR 17X24 6PK STRL BLUE (TOWEL DISPOSABLE) ×6 IMPLANT
TUBE CONNECTING 20'X1/4 (TUBING) ×1
TUBE CONNECTING 20X1/4 (TUBING) ×2 IMPLANT
UNDERPAD 30X30 (UNDERPADS AND DIAPERS) ×3 IMPLANT

## 2016-02-12 NOTE — Anesthesia Procedure Notes (Signed)
Procedure Name: LMA Insertion Date/Time: 02/12/2016 3:45 PM Performed by: Toula Moos L Pre-anesthesia Checklist: Patient identified, Emergency Drugs available, Suction available, Patient being monitored and Timeout performed Patient Re-evaluated:Patient Re-evaluated prior to inductionOxygen Delivery Method: Circle system utilized Preoxygenation: Pre-oxygenation with 100% oxygen Intubation Type: IV induction Ventilation: Mask ventilation without difficulty LMA: LMA inserted LMA Size: 5.0 Number of attempts: 1 Airway Equipment and Method: Bite block Placement Confirmation: positive ETCO2 Tube secured with: Tape Dental Injury: Teeth and Oropharynx as per pre-operative assessment
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
## 2016-02-12 NOTE — Anesthesia Preprocedure Evaluation (Signed)
Anesthesia Evaluation  Patient identified by MRN, date of birth, ID band Patient awake    Reviewed: Allergy & Precautions, NPO status , Patient's Chart, lab work & pertinent test results  Airway Mallampati: II  TM Distance: >3 FB Neck ROM: Full    Dental  (+) Dental Advisory Given   Pulmonary neg COPD,    breath sounds clear to auscultation       Cardiovascular (-) hypertension(-) angina(-) CAD  Rhythm:Regular Rate:Normal  +HLD   Neuro/Psych neg Seizures    GI/Hepatic Neg liver ROS, neg GERD  ,  Endo/Other  neg diabetes  Renal/GU Renal disease     Musculoskeletal   Abdominal   Peds  Hematology negative hematology ROS (+)   Anesthesia Other Findings   Reproductive/Obstetrics                             Anesthesia Physical Anesthesia Plan  ASA: I  Anesthesia Plan: General and Regional   Post-op Pain Management:  Regional for Post-op pain   Induction: Intravenous  Airway Management Planned: LMA and Oral ETT  Additional Equipment:   Intra-op Plan:   Post-operative Plan: Extubation in OR  Informed Consent: I have reviewed the patients History and Physical, chart, labs and discussed the procedure including the risks, benefits and alternatives for the proposed anesthesia with the patient or authorized representative who has indicated his/her understanding and acceptance.   Dental advisory given  Plan Discussed with: CRNA  Anesthesia Plan Comments:         Anesthesia Quick Evaluation

## 2016-02-12 NOTE — Anesthesia Procedure Notes (Signed)
Anesthesia Regional Block:  Supraclavicular block  Pre-Anesthetic Checklist: ,, timeout performed, Correct Patient, Correct Site, Correct Laterality, Correct Procedure, Correct Position, site marked, Risks and benefits discussed,  Surgical consent,  Pre-op evaluation,  At surgeon's request and post-op pain management  Laterality: Left  Prep: chloraprep       Needles:  Injection technique: Single-shot  Needle Type: Echogenic Stimulator Needle     Needle Length: 9cm 9 cm Needle Gauge: 21 G    Additional Needles:  Procedures: ultrasound guided (picture in chart) and nerve stimulator Supraclavicular block  Nerve Stimulator or Paresthesia:  Response: finger extension, biceps flexion, 0.5 mA,   Additional Responses:   Narrative:  Start time: 02/12/2016 2:35 PM End time: 02/12/2016 2:42 PM Injection made incrementally with aspirations every 5 mL.  Performed by: Personally  Anesthesiologist: Suzette Battiest  Additional Notes: Risks, benefits and alternative to block explained extensively.  Patient tolerated procedure well, without complications.

## 2016-02-12 NOTE — Transfer of Care (Signed)
Immediate Anesthesia Transfer of Care Note  Patient: Raymond Ferguson  Procedure(s) Performed: Procedure(s): OPEN REDUCTION INTERNAL FIXATION (ORIF) ELBOW/OLECRANON FRACTURE, LEFT (Left)  Patient Location: PACU  Anesthesia Type:GA combined with regional for post-op pain  Level of Consciousness: sedated  Airway & Oxygen Therapy: Patient Spontanous Breathing and Patient connected to face mask oxygen  Post-op Assessment: Report given to RN and Post -op Vital signs reviewed and stable  Post vital signs: Reviewed and stable  Last Vitals:  Vitals:   02/12/16 1702 02/12/16 1703  BP: 100/71   Pulse: 81 78  Resp:  13  Temp:  (P) 36.5 C    Last Pain:  Vitals:   02/12/16 1351  TempSrc: Oral         Complications: No apparent anesthesia complications

## 2016-02-12 NOTE — Op Note (Signed)
I assisted Surgeon(s) and Role:    * Leanora Cover, MD - Primary    * Daryll Brod, MD - Assisting on the Procedure(s): OPEN REDUCTION INTERNAL FIXATION (ORIF) ELBOW/OLECRANON FRACTURE, LEFT on 02/12/2016.  I provided assistance on this case as follows: set-up, approach, debridement and reduction of the fracture,placement of the internal fixation, closure of the wound, application of the dressings and splint. I was present for the entire case.  Electronically signed by: Wynonia Sours, MD Date: 02/12/2016 Time: 4:57 PM

## 2016-02-12 NOTE — Progress Notes (Signed)
Assisted Dr. Rob Fitzgerald with left, ultrasound guided, interscalene  block. Side rails up, monitors on throughout procedure. See vital signs in flow sheet. Tolerated Procedure well. 

## 2016-02-12 NOTE — Discharge Instructions (Addendum)
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
## 2016-02-12 NOTE — Op Note (Signed)
[id number] 
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
## 2016-02-13 NOTE — Anesthesia Postprocedure Evaluation (Signed)
Anesthesia Post Note  Patient: Raymond Ferguson  Procedure(s) Performed: Procedure(s) (LRB): OPEN REDUCTION INTERNAL FIXATION (ORIF) ELBOW/OLECRANON FRACTURE, LEFT (Left)  Patient location during evaluation: PACU Anesthesia Type: General and Regional Level of consciousness: awake and alert Pain management: pain level controlled Vital Signs Assessment: post-procedure vital signs reviewed and stable Respiratory status: spontaneous breathing, nonlabored ventilation, respiratory function stable and patient connected to nasal cannula oxygen Cardiovascular status: blood pressure returned to baseline and stable Postop Assessment: no signs of nausea or vomiting Anesthetic complications: no    Last Vitals:  Vitals:   02/12/16 1745 02/12/16 1818  BP: 113/77 122/74  Pulse: 81 72  Resp: 11 16  Temp:      Last Pain:  Vitals:   02/12/16 1818  TempSrc: Oral  PainSc: 0-No pain                 Tiajuana Amass

## 2016-02-14 ENCOUNTER — Encounter (HOSPITAL_BASED_OUTPATIENT_CLINIC_OR_DEPARTMENT_OTHER): Payer: Self-pay | Admitting: Orthopedic Surgery
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
## 2018-05-06 DIAGNOSIS — M79672 Pain in left foot: Secondary | ICD-10-CM | POA: Diagnosis not present

## 2018-05-06 DIAGNOSIS — S93402A Sprain of unspecified ligament of left ankle, initial encounter: Secondary | ICD-10-CM | POA: Diagnosis not present

## 2018-05-06 DIAGNOSIS — S92332A Displaced fracture of third metatarsal bone, left foot, initial encounter for closed fracture: Secondary | ICD-10-CM | POA: Diagnosis not present

## 2018-05-06 DIAGNOSIS — S92322A Displaced fracture of second metatarsal bone, left foot, initial encounter for closed fracture: Secondary | ICD-10-CM | POA: Diagnosis not present

## 2018-05-06 DIAGNOSIS — M25562 Pain in left knee: Secondary | ICD-10-CM | POA: Diagnosis not present

## 2018-05-06 DIAGNOSIS — S92342A Displaced fracture of fourth metatarsal bone, left foot, initial encounter for closed fracture: Secondary | ICD-10-CM | POA: Diagnosis not present

## 2018-05-19 DIAGNOSIS — M25562 Pain in left knee: Secondary | ICD-10-CM | POA: Diagnosis not present

## 2018-05-31 DIAGNOSIS — Z125 Encounter for screening for malignant neoplasm of prostate: Secondary | ICD-10-CM | POA: Diagnosis not present

## 2018-05-31 DIAGNOSIS — Z0000 Encounter for general adult medical examination without abnormal findings: Secondary | ICD-10-CM | POA: Diagnosis not present

## 2018-05-31 DIAGNOSIS — D509 Iron deficiency anemia, unspecified: Secondary | ICD-10-CM | POA: Diagnosis not present

## 2018-05-31 DIAGNOSIS — Z1159 Encounter for screening for other viral diseases: Secondary | ICD-10-CM | POA: Diagnosis not present

## 2018-06-02 DIAGNOSIS — H6123 Impacted cerumen, bilateral: Secondary | ICD-10-CM | POA: Diagnosis not present

## 2018-06-03 DIAGNOSIS — Z9289 Personal history of other medical treatment: Secondary | ICD-10-CM | POA: Diagnosis not present

## 2018-06-03 DIAGNOSIS — Z8579 Personal history of other malignant neoplasms of lymphoid, hematopoietic and related tissues: Secondary | ICD-10-CM | POA: Diagnosis not present

## 2018-06-03 DIAGNOSIS — Z9889 Other specified postprocedural states: Secondary | ICD-10-CM | POA: Diagnosis not present

## 2018-06-03 DIAGNOSIS — Z125 Encounter for screening for malignant neoplasm of prostate: Secondary | ICD-10-CM | POA: Diagnosis not present

## 2018-06-03 DIAGNOSIS — M79674 Pain in right toe(s): Secondary | ICD-10-CM | POA: Diagnosis not present

## 2018-06-03 DIAGNOSIS — N4289 Other specified disorders of prostate: Secondary | ICD-10-CM | POA: Diagnosis not present

## 2018-06-03 DIAGNOSIS — Z23 Encounter for immunization: Secondary | ICD-10-CM | POA: Diagnosis not present

## 2018-06-03 DIAGNOSIS — Z8042 Family history of malignant neoplasm of prostate: Secondary | ICD-10-CM | POA: Diagnosis not present

## 2018-06-03 DIAGNOSIS — E7800 Pure hypercholesterolemia, unspecified: Secondary | ICD-10-CM | POA: Diagnosis not present

## 2018-06-03 DIAGNOSIS — Z0001 Encounter for general adult medical examination with abnormal findings: Secondary | ICD-10-CM | POA: Diagnosis not present

## 2018-06-16 DIAGNOSIS — M79672 Pain in left foot: Secondary | ICD-10-CM | POA: Diagnosis not present

## 2018-06-16 DIAGNOSIS — M25562 Pain in left knee: Secondary | ICD-10-CM | POA: Diagnosis not present

## 2018-06-22 ENCOUNTER — Ambulatory Visit: Payer: PPO | Admitting: Podiatry

## 2018-07-05 DIAGNOSIS — H6123 Impacted cerumen, bilateral: Secondary | ICD-10-CM | POA: Diagnosis not present

## 2018-07-06 ENCOUNTER — Ambulatory Visit: Payer: PPO | Admitting: Podiatry

## 2018-07-08 ENCOUNTER — Ambulatory Visit (INDEPENDENT_AMBULATORY_CARE_PROVIDER_SITE_OTHER): Payer: PPO

## 2018-07-08 ENCOUNTER — Encounter: Payer: Self-pay | Admitting: Podiatry

## 2018-07-08 ENCOUNTER — Ambulatory Visit: Payer: PPO | Admitting: Podiatry

## 2018-07-08 VITALS — BP 124/83 | HR 59 | Resp 16

## 2018-07-08 DIAGNOSIS — M778 Other enthesopathies, not elsewhere classified: Secondary | ICD-10-CM

## 2018-07-08 DIAGNOSIS — M779 Enthesopathy, unspecified: Secondary | ICD-10-CM | POA: Diagnosis not present

## 2018-07-08 DIAGNOSIS — M205X1 Other deformities of toe(s) (acquired), right foot: Secondary | ICD-10-CM | POA: Diagnosis not present

## 2018-07-08 DIAGNOSIS — D649 Anemia, unspecified: Secondary | ICD-10-CM | POA: Insufficient documentation

## 2018-07-08 NOTE — Progress Notes (Signed)
  Subjective:  Patient ID: Raymond Ferguson, male    DOB: [birth year],  MRN: [id number] HPI Chief Complaint  Patient presents with  . Foot Pain    1st MPJ right - aching x years, notices that the arthritis is getting worse, limited ROM, tried exercising the joint, injury years ago  . New Patient (Initial Visit)    63 y.o. male presents with the above complaint.   ROS: Denies fever chills nausea vomiting muscle aches pains calf pain back pain chest pain shortness of breath.  Past Medical History:  Diagnosis Date  . Elbow fracture    Past Surgical History:  Procedure Laterality Date  . ORIF ELBOW FRACTURE Left 02/12/2016   Procedure: OPEN REDUCTION INTERNAL FIXATION (ORIF) ELBOW/OLECRANON FRACTURE, LEFT;  Surgeon: Leanora Cover, MD;  Location: [place];  Service: Orthopedics;  Laterality: Left;  . TONSILLECTOMY      Current Outpatient Medications:  .  rosuvastatin (CRESTOR) 10 MG tablet, Take 10 mg by mouth daily., Disp: , Rfl:   No Known Allergies Review of Systems Objective:   Vitals:   07/08/18 1014  BP: 124/83  Pulse: (!) 59  Resp: 16    General: Well developed, nourished, in no acute distress, alert and oriented x3   Dermatological: Skin is warm, dry and supple bilateral. Nails x 10 are well maintained; remaining integument appears unremarkable at this time. There are no open sores, no preulcerative lesions, no rash or signs of infection present.  Vascular: Dorsalis Pedis artery and Posterior Tibial artery pedal pulses are 2/4 bilateral with immedate capillary fill time. Pedal hair growth present. No varicosities and no lower extremity edema present bilateral.   Neruologic: Grossly intact via light touch bilateral. Vibratory intact via tuning fork bilateral. Protective threshold with Semmes Wienstein monofilament intact to all pedal sites bilateral. Patellar and Achilles deep tendon reflexes 2+ bilateral. No Babinski or clonus noted bilateral.    Musculoskeletal: No gross boney pedal deformities bilateral. No pain, crepitus, or limitation noted with foot and ankle range of motion bilateral. Muscular strength 5/5 in all groups tested bilateral.  Pain on attempted range of motion of the first metatarsophalangeal joint right foot. Gait: Unassisted, Nonantalgic.    Radiographs:  Radiographs of the first metatarsal phalangeal joint of the right foot was taken today demonstrating severe osteoarthritic changes bone-on-bone contact joint space narrowing subchondral sclerosis and and eburnation.  Assessment & Plan:   Assessment: Hallux rigidus first metatarsophalangeal joint right foot.  Plan: Discussed etiology pathology conservative surgical therapies this point time discussed joint replacement.  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     Max T. [place], [place]

## 2018-07-21 ENCOUNTER — Ambulatory Visit (INDEPENDENT_AMBULATORY_CARE_PROVIDER_SITE_OTHER): Payer: PPO | Admitting: Licensed Clinical Social Worker

## 2018-07-21 ENCOUNTER — Other Ambulatory Visit: Payer: Self-pay

## 2018-07-21 DIAGNOSIS — Z639 Problem related to primary support group, unspecified: Secondary | ICD-10-CM

## 2018-07-21 DIAGNOSIS — F411 Generalized anxiety disorder: Secondary | ICD-10-CM | POA: Diagnosis not present

## 2018-07-21 DIAGNOSIS — F320 Major depressive disorder, single episode, mild: Secondary | ICD-10-CM
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
## 2018-08-09 ENCOUNTER — Ambulatory Visit (INDEPENDENT_AMBULATORY_CARE_PROVIDER_SITE_OTHER): Payer: PPO | Admitting: Licensed Clinical Social Worker

## 2018-08-09 DIAGNOSIS — F320 Major depressive disorder, single episode, mild: Secondary | ICD-10-CM

## 2018-08-09 DIAGNOSIS — Z639 Problem related to primary support group, unspecified: Secondary | ICD-10-CM | POA: Diagnosis not present

## 2018-08-09 DIAGNOSIS — F411 Generalized anxiety disorder: Secondary | ICD-10-CM | POA: Diagnosis not present
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
## 2018-08-16 ENCOUNTER — Ambulatory Visit (HOSPITAL_COMMUNITY): Payer: PPO | Admitting: Licensed Clinical Social Worker

## 2018-08-16 DIAGNOSIS — F419 Anxiety disorder, unspecified: Secondary | ICD-10-CM | POA: Diagnosis not present

## 2018-08-31 ENCOUNTER — Other Ambulatory Visit: Payer: Self-pay | Admitting: *Deleted

## 2018-09-01 ENCOUNTER — Other Ambulatory Visit: Payer: Self-pay

## 2018-09-01 ENCOUNTER — Other Ambulatory Visit: Payer: Self-pay | Admitting: *Deleted

## 2018-09-01 DIAGNOSIS — E782 Mixed hyperlipidemia: Secondary | ICD-10-CM

## 2018-09-01 NOTE — Patient Outreach (Signed)
[facility] HRA follow up call attempted but no answer. Left a message and requested a return call.  Raymond Ferguson. Myrtie Neither, MSN, [facility] Gerontological Nurse Practitioner [facility] Care Management [phone]

## 2018-09-06 ENCOUNTER — Encounter: Payer: Self-pay | Admitting: *Deleted

## 2018-09-06 DIAGNOSIS — E785 Hyperlipidemia, unspecified: Secondary | ICD-10-CM | POA: Insufficient documentation

## 2018-09-06 NOTE — Patient Outreach (Signed)
[facility] HRA Follow up call.  Mr. Levitz agrees to follow up with me every 6 months or more often if he has a question or issue to be discussed.  Sending him our information for future reference.  Eulah Pont. Myrtie Neither, MSN, [facility] Gerontological Nurse Practitioner [facility] Care Management [phone]

## 2018-09-24 DIAGNOSIS — H6123 Impacted cerumen, bilateral: Secondary | ICD-10-CM | POA: Diagnosis not present

## 2018-10-18 ENCOUNTER — Other Ambulatory Visit: Payer: Self-pay

## 2018-10-18 ENCOUNTER — Ambulatory Visit (INDEPENDENT_AMBULATORY_CARE_PROVIDER_SITE_OTHER): Payer: PPO | Admitting: Licensed Clinical Social Worker

## 2018-10-18 DIAGNOSIS — F411 Generalized anxiety disorder: Secondary | ICD-10-CM

## 2018-10-18 DIAGNOSIS — Z639 Problem related to primary support group, unspecified: Secondary | ICD-10-CM | POA: Diagnosis not present

## 2018-10-18 DIAGNOSIS — F320 Major depressive disorder, single episode, mild: Secondary | ICD-10-CM | POA: Diagnosis not present

## 2018-10-18 NOTE — Progress Notes (Signed)
Virtual Visit via Telephone Note  I connected with Raymond Ferguson on 10/18/18 at  1:00 PM EDT by telephone and verified that I am speaking with the correct person using two identifiers.   I discussed the limitations, risks, security and privacy concerns of performing an evaluation and management service by telephone and the availability of in person appointments. I also discussed with the patient that there may be a patient responsible charge related to this service. The patient expressed understanding and agreed to proceed.  Follow Up Instructions:    I discussed the assessment and treatment plan with the patient. The patient was provided an opportunity to ask questions and all were answered. The patient agreed with the plan and demonstrated an understanding of the instructions.   The patient was advised to call back or seek an in-person evaluation if the symptoms worsen or if the condition fails to improve as anticipated.  I provided 56 minutes of non-face-to-face time during this encounter.   THERAPIST PROGRESS NOTE  Session Time: 1:02 PM to 1:58 PM  Participation Level: Active  Behavioral Response: CasualAlertIrritable and frustrated  Type of Therapy: Individual Therapy  Treatment Goals addressed:  address relationship stressors, decrease anxiety and depression, coping Interventions: Motivational Interviewing, Solution Focused, Strength-based, Supportive, Reframing and Other: coping  Summary: Raymond Ferguson is a 63 y.o. male who presents with I am trying to resolve things, more intensely with daughter and her husband. Attempted to go to marriage counseling. I feel it turned into a fiasco. Could never do a virtual visit with all of them there. Raymond Ferguson (his wife) continues to say that it is more my problem.  A friend asked him why haven't you divorced.  Pointed out to patient that this is a good question to help patient get clarity about things. The issue seems to be fidelity  and not the first time it has happened. Married for 34 years and not lived with each other 69 years. We live completely separate lives, file separate tax returns, basically I take care of her, pay quite a bit for health insurance. What did she do for me. I get nothing in return. Talked about son-in-law Raymond Ferguson, he got my apple password and got on to my devices and got a lot of private information.  A lot of demands over head before I see my grandson. That is a tough situation. I have received emails and they basically say own up to what you did, take responsibility. They want patient to apologize for lashing out that patient has caused a lot of hurt. Discussed cutting off one of relationships and patient describes their intrusiveness in knowing about his relationships. Basically they are mad at me. Hold grandchild, Raymond Ferguson,  over my head. Him. I am willing to apologize and all I am asking is to respect my privacy. Son's attitude is that he doesn't owe patient anything and that he is right about everything. Doesn't want to be dead to them. Except for playing together once there hasn't been any conversations except demands. Asked daughter what if we divorce would that change things. Right away daughter called mom. Like a good relationship with daughter, grandson. I guess I let it go until she responds differently. No more to be said. Nothing more he can do to change the situation. Therapist shared her perspective that daughter is not the one to make decisions in the relationship and patient agrees. Inquired about what they (Raymond Ferguson and patient) have discussed in terms of their relationship.  Talked to Raymond Ferguson but not a lot of hard conversations and that is fine I rather just get along.  Again reviewed question why not divorced. I don't have a good answer. I broke off relationship with wonderful people because I wanted to be with Raymond Ferguson. Deeply I love her, I express my love to her and her parents, shared a lot of things but  more like a platonic relationship. We lived  separate lives. Lived separate and I have  accumulated a little for myself I don't know if she would go after my assets. Divorce is ugly. Taking care of all her expenses.  With coronavirus not been over to my house.   Obvious problems in relationship don't spend time with each other.  Therapist reframed pointed out that they are talking to each other about the relationship. It doesn't have to be anybodies fault but focus on what needs to change, what growth needs to happen. Shares that it is framed as if he is 1 with terrible problems in coming to therapy is pulling out bad elements.   Look at relationship in terms of Pros and cons.  She is a heavy smoker and she might need my help.  Therapist pointed out that it seems an important that she is taking care of.  Shares that he loves helping people, likes to be like that and therapist identify as an important aspect of being in the relationship.   Reviewed what he would want if he could have life the way he wants it and he shares that he would like to have the level that she and I had with my wife.   Encourage patient to share this with wife and he stares not sure that is possible. Says she does want more intimacy and nothing comes from it. Hasn't been in a long time. I am more interested.Encourage patient to share this with wife.  Reviewing session shares I can't live my life like that and the stress. Had enough.  Up to them when I can see Raymond Ferguson( grandson). I don't have anything else I can do except to send the message to Raymond Ferguson (daughter) that I love you.     Suicidal/Homicidal: No  Therapist Response: Therapist assessed patient current functioning per report and processed feelings related to current stressors.  Assessed patient situation and related to patient that he does have expectation of privacy, that this is a basic right that everybody has and that issues in his relationship with his wife are between him  and his wife.   In helping insight discussed how helpful for his daughter and family to put themselves in his shoes, think what would be like if he were to use drop on their conversations to help with understanding how he feels.   Reviewed the magic question and asked if things were exactly as he wished what he would want to better understand patient's needs.  Identify would like a intimate relationship with so explored with patient steps that would help him move in that direction, explored ways to improve the relationship.  Explained that affairs are often signs that something is going wrong in the relationship so in order to repair it you have to go inward toward your partner and work on problems to help move forward and this work involves the people involved in the relationship not outside of the relationship.  Discussed how there is actually growth that can come about through infidelity. Discussed pros and cons of relationship, identify pros that encourage  patient to continue to try to work on it, also related that this is something he will need to continue to evaluate making decisions. Provided strength based on supportive intervention Plan: Return again in 2 weeks.2.  Plan for session with wife to facilitate communication that will help in terms of working on relationship. 3.  Therapist work with patient on stress management, relationship issues, coping  Diagnosis: Axis I: generalized anxiety disorder, major depressive disorder, mild unspecified whether recurrent, relationship problem    Axis II: No diagnosis    Cordella Register, LCSW 10/18/2018

## 2018-11-03 ENCOUNTER — Ambulatory Visit (INDEPENDENT_AMBULATORY_CARE_PROVIDER_SITE_OTHER): Payer: PPO | Admitting: Licensed Clinical Social Worker

## 2018-11-03 DIAGNOSIS — Z639 Problem related to primary support group, unspecified: Secondary | ICD-10-CM | POA: Diagnosis not present

## 2018-11-03 DIAGNOSIS — F411 Generalized anxiety disorder: Secondary | ICD-10-CM | POA: Diagnosis not present

## 2018-11-03 DIAGNOSIS — F320 Major depressive disorder, single episode, mild: Secondary | ICD-10-CM | POA: Diagnosis not present

## 2018-11-03 NOTE — Progress Notes (Signed)
Virtual Visit via Telephone Note  I connected with Raymond Ferguson on 11/03/18 at  3:00 PM EDT by telephone and verified that I am speaking with the correct person using two identifiers.   I discussed the limitations, risks, security and privacy concerns of performing an evaluation and management service by telephone and the availability of in person appointments. I also discussed with the patient that there may be a patient responsible charge related to this service. The patient expressed understanding and agreed to proceed.  Follow Up Instructions:    I discussed the assessment and treatment plan with the patient. The patient was provided an opportunity to ask questions and all were answered. The patient agreed with the plan and demonstrated an understanding of the instructions.   The patient was advised to call back or seek an in-person evaluation if the symptoms worsen or if the condition fails to improve as anticipated.  I provided 30 minutes of non-face-to-face time during this encounter.    THERAPIST PROGRESS NOTE  Session Time: 3:00 PM to 3:30 PM  Participation Level: Active  Behavioral Response: CasualAlertEuthymic  Type of Therapy: Individual Therapy  Treatment Goals addressed: address relationship stressors, decrease anxiety and depression, coping  Interventions: Solution Focused, Strength-based, coping and Other: healthy relationship skills, coping  Summary: Raymond Ferguson is a 63 y.o. male who presents with pretty good, just finished fixing a door and looking forward to having a patio table Come.  Reviewed current status with relationships.  And his wife, [name], have been spending more time together, going on walks and has really enjoyed it.  Has not been too deep.  Therapist discussed how spending time together help strengthen the relationship, can help in terms of opening communication as spending more time together.  Patient needs to continue to assess in terms of  choices in relationship as he still does not know if his needs can be met in terms of intimacy, they cannot live together so not sure how it will work out.  Had been spending time together before coronavirus and now getting back to this.  Scribes it as a good thing and a big change. With daughter he is at a impasse.  List of demands, she wants me to apologize and admit that I am wrong that is not a problem for me.  He went on and on with demands.  Enough of it.  Right now there are roles, and at the time the father this is the way it is going to be, that my private life is none of your business.   Wife not in session because not ready for openness as not clear what type of relationship they will have.  It may get better on its own, however the source of the problem may not go away what I want which is more intimacy.  Does not want to be in session until things are resolved. Extramarital relationship he enjoys spending time with them.  It is Is hard letting go, as this is one of few opportunities for company as well as not  Having intimacy with his wife. Without other friendship would be isolated.  Still cannot imagine a life without her, I love her, I continue to do everything to make sure she is taking care of. I can't imagine her not in my life.  I have been honest with these women (relationship outside the marriage) but they still want to see me.   See what happens (with wife) we love each  other. 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 I was doing them all the time. Things have moved on and misses this.  I recognize that change is happening all the time and therapist pointed out realizing we cannot get everything we want in life but with hopefulness, determination patient's they can help [place] get toward goals.  Suicidal/Homicidal: No  Therapist Response: Therapist assessed patient current functioning per report and processed feelings related to  current stressors.  Provided positive feedback for patient setting boundaries in his relationship with his daughter, additionally setting rules straight that he is the parent and she is the daughter.  Discussed that boundaries help to protect ourselves in relationships, also claiming his will as father helps in correcting any dysfunction in relationship. Assessed patient's feelings about extramarital relationships and current relationship with wife.  Has taken steps to make himself feel better and being honest with extreme marital relationships.  Also not making too many adjustments until has clarity about his needs getting met in his relationship with wife.  He is spending more time with her and strategy is to see how things develop and therapist feedback was this seems to be a strategy that fits current situation, as he spends more time with wife that may help to strengthen as well as open communication.  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  Help patient as being constructive and keeping busy, doing for others assessing is healthy coping.   Provided strength based and supportive interventions. Plan: Return again in 2 weeks.2.patient to review where he is with things in 2 weeks to check and to evaluate whether he wants to continue therapeutic services. 3.  Purpose work with patient on processing thoughts and feelings related to relationships and helping him gaining clarity and choices for himself related to the relationship. 4.  Therapist continue to work with patient on coping strategies  Diagnosis: Axis I: generalized anxiety disorder, major depressive disorder, mild unspecified whether recurrent, relationship problem    Axis II: No diagnosis    Cordella Register, LCSW 11/03/2018

## 2018-11-17 ENCOUNTER — Ambulatory Visit (HOSPITAL_COMMUNITY): Payer: PPO | Admitting: Licensed Clinical Social Worker

## 2018-11-17 ENCOUNTER — Other Ambulatory Visit: Payer: Self-pay

## 2018-11-23 DIAGNOSIS — H6123 Impacted cerumen, bilateral: Secondary | ICD-10-CM | POA: Diagnosis not present

## 2018-12-06 ENCOUNTER — Ambulatory Visit (HOSPITAL_COMMUNITY): Payer: PPO | Admitting: Licensed Clinical Social Worker

## 2019-01-24 ENCOUNTER — Ambulatory Visit: Payer: Self-pay | Admitting: *Deleted

## 2019-02-03 DIAGNOSIS — H6123 Impacted cerumen, bilateral: Secondary | ICD-10-CM | POA: Diagnosis not present

## 2019-02-24 DIAGNOSIS — H6123 Impacted cerumen, bilateral: Secondary | ICD-10-CM | POA: Diagnosis not present

## 2019-02-28 DIAGNOSIS — F4320 Adjustment disorder, unspecified: Secondary | ICD-10-CM | POA: Diagnosis not present

## 2019-03-01 ENCOUNTER — Encounter (INDEPENDENT_AMBULATORY_CARE_PROVIDER_SITE_OTHER): Payer: Self-pay

## 2019-03-12 DIAGNOSIS — F4320 Adjustment disorder, unspecified: Secondary | ICD-10-CM | POA: Diagnosis not present

## 2019-03-20 DIAGNOSIS — F4320 Adjustment disorder, unspecified: Secondary | ICD-10-CM | POA: Diagnosis not present

## 2019-03-27 DIAGNOSIS — F4320 Adjustment disorder, unspecified: Secondary | ICD-10-CM | POA: Diagnosis not present

## 2019-04-03 DIAGNOSIS — F4320 Adjustment disorder, unspecified: Secondary | ICD-10-CM | POA: Diagnosis not present

## 2019-04-10 DIAGNOSIS — F4320 Adjustment disorder, unspecified: Secondary | ICD-10-CM | POA: Diagnosis not present

## 2019-04-17 DIAGNOSIS — F4320 Adjustment disorder, unspecified: Secondary | ICD-10-CM | POA: Diagnosis not present

## 2019-04-24 DIAGNOSIS — F4320 Adjustment disorder, unspecified: Secondary | ICD-10-CM | POA: Diagnosis not present

## 2019-05-01 DIAGNOSIS — F4320 Adjustment disorder, unspecified: Secondary | ICD-10-CM | POA: Diagnosis not present

## 2019-05-05 ENCOUNTER — Ambulatory Visit (INDEPENDENT_AMBULATORY_CARE_PROVIDER_SITE_OTHER): Payer: PPO | Admitting: Otolaryngology

## 2019-05-05 ENCOUNTER — Other Ambulatory Visit: Payer: Self-pay

## 2019-05-05 VITALS — Temp 97.9°F

## 2019-05-05 DIAGNOSIS — H6123 Impacted cerumen, bilateral: Secondary | ICD-10-CM | POA: Diagnosis not present

## 2019-05-05 NOTE — Progress Notes (Signed)
HPI: Raymond Ferguson is a 63 y.o. male who presents for evaluation of cerumen impactions bilaterally.  Presently seems worse on the right side..  Past Medical History:  Diagnosis Date  . Elbow fracture    Past Surgical History:  Procedure Laterality Date  . ORIF ELBOW FRACTURE Left 02/12/2016   Procedure: OPEN REDUCTION INTERNAL FIXATION (ORIF) ELBOW/OLECRANON FRACTURE, LEFT;  Surgeon: Leanora Cover, MD;  Location: [facility];  Service: Orthopedics;  Laterality: Left;  . TONSILLECTOMY     Social History   Socioeconomic History  . Marital status: Married    Spouse name: Not on file  . Number of children: Not on file  . Years of education: Not on file  . Highest education level: Not on file  Occupational History  . Not on file  Tobacco Use  . Smoking status: Never Smoker  . Smokeless tobacco: Never Used  Substance and Sexual Activity  . Alcohol use: Yes  . Drug use: No  . Sexual activity: Not on file  Other Topics Concern  . Not on file  Social History Narrative  . Not on file   Social Determinants of Health   Financial Resource Strain:   . Difficulty of Paying Living Expenses: Not on file  Food Insecurity:   . Worried About Charity fundraiser in the Last Year: Not on file  . Ran Out of Food in the Last Year: Not on file  Transportation Needs:   . Lack of Transportation (Medical): Not on file  . Lack of Transportation (Non-Medical): Not on file  Physical Activity:   . Days of Exercise per Week: Not on file  . Minutes of Exercise per Session: Not on file  Stress:   . Feeling of Stress : Not on file  Social Connections:   . Frequency of Communication with Friends and Family: Not on file  . Frequency of Social Gatherings with Friends and Family: Not on file  . Attends Religious Services: Not on file  . Active Member of Clubs or Organizations: Not on file  . Attends Archivist Meetings: Not on file  . Marital Status: Not on file   No  family history on file. No Known Allergies Prior to Admission medications   Medication Sig Start Date End Date Taking? Authorizing Provider  rosuvastatin (CRESTOR) 10 MG tablet Take 10 mg by mouth daily.   Yes [provider]     Positive ROS: Otherwise negative  All other systems have been reviewed and were otherwise negative with the exception of those mentioned in the HPI and as above.  Physical Exam: Constitutional: Alert, well-appearing, no acute distress Ears: External ears without lesions or tenderness. Ear canals have a large amount of wax in both ear canals that was cleaned with suction and curettes.. Nasal: External nose without lesions. Clear nasal passages Oral: Oropharynx clear. Neck: No palpable adenopathy or masses Respiratory: Breathing comfortably  Skin: No facial/neck lesions or rash noted.  Cerumen impaction removal  Date/Time: 05/05/2019 2:41 PM Performed by: Rozetta Nunnery, MD Authorized by: Rozetta Nunnery, MD   Consent:    Consent obtained:  Verbal   Consent given by:  Patient   Risks discussed:  Pain and bleeding Procedure details:    Location:  L ear and R ear   Procedure type: curette and suction   Post-procedure details:    Inspection:  TM intact and canal normal   Hearing quality:  Improved   Patient tolerance of  procedure:  Tolerated well, no immediate complications    Assessment: Cerumen impactions  Plan: He will follow-up as needed  [name], MD

## 2019-05-08 DIAGNOSIS — F4320 Adjustment disorder, unspecified: Secondary | ICD-10-CM | POA: Diagnosis not present

## 2019-05-15 DIAGNOSIS — F4320 Adjustment disorder, unspecified: Secondary | ICD-10-CM | POA: Diagnosis not present

## 2019-05-22 DIAGNOSIS — F4320 Adjustment disorder, unspecified: Secondary | ICD-10-CM | POA: Diagnosis not present

## 2019-05-29 DIAGNOSIS — F4320 Adjustment disorder, unspecified: Secondary | ICD-10-CM | POA: Diagnosis not present

## 2019-06-06 DIAGNOSIS — D509 Iron deficiency anemia, unspecified: Secondary | ICD-10-CM | POA: Diagnosis not present

## 2019-06-06 DIAGNOSIS — Z125 Encounter for screening for malignant neoplasm of prostate: Secondary | ICD-10-CM | POA: Diagnosis not present

## 2019-06-06 DIAGNOSIS — E7800 Pure hypercholesterolemia, unspecified: Secondary | ICD-10-CM | POA: Diagnosis not present

## 2019-06-09 DIAGNOSIS — E7800 Pure hypercholesterolemia, unspecified: Secondary | ICD-10-CM | POA: Diagnosis not present

## 2019-06-09 DIAGNOSIS — Z1211 Encounter for screening for malignant neoplasm of colon: Secondary | ICD-10-CM | POA: Diagnosis not present

## 2019-06-09 DIAGNOSIS — Z8579 Personal history of other malignant neoplasms of lymphoid, hematopoietic and related tissues: Secondary | ICD-10-CM | POA: Diagnosis not present

## 2019-06-09 DIAGNOSIS — Z1212 Encounter for screening for malignant neoplasm of rectum: Secondary | ICD-10-CM | POA: Diagnosis not present

## 2019-06-09 DIAGNOSIS — Z0001 Encounter for general adult medical examination with abnormal findings: Secondary | ICD-10-CM | POA: Diagnosis not present

## 2019-06-20 ENCOUNTER — Ambulatory Visit (INDEPENDENT_AMBULATORY_CARE_PROVIDER_SITE_OTHER): Payer: PPO | Admitting: Otolaryngology

## 2019-06-20 ENCOUNTER — Other Ambulatory Visit: Payer: Self-pay

## 2019-06-20 VITALS — Temp 98.4°F

## 2019-06-20 DIAGNOSIS — H6123 Impacted cerumen, bilateral: Secondary | ICD-10-CM | POA: Diagnosis not present

## 2019-06-20 NOTE — Progress Notes (Signed)
HPI: Raymond Ferguson is a 64 y.o. male who presents for evaluation of cerumen buildup.  Right side worse than left..  Past Medical History:  Diagnosis Date  . Elbow fracture    Past Surgical History:  Procedure Laterality Date  . ORIF ELBOW FRACTURE Left 02/12/2016   Procedure: OPEN REDUCTION INTERNAL FIXATION (ORIF) ELBOW/OLECRANON FRACTURE, LEFT;  Surgeon: Leanora Cover, MD;  Location: [facility];  Service: Orthopedics;  Laterality: Left;  . TONSILLECTOMY     Social History   Socioeconomic History  . Marital status: Married    Spouse name: Not on file  . Number of children: Not on file  . Years of education: Not on file  . Highest education level: Not on file  Occupational History  . Not on file  Tobacco Use  . Smoking status: Never Smoker  . Smokeless tobacco: Never Used  Substance and Sexual Activity  . Alcohol use: Yes  . Drug use: No  . Sexual activity: Not on file  Other Topics Concern  . Not on file  Social History Narrative  . Not on file   Social Determinants of Health   Financial Resource Strain:   . Difficulty of Paying Living Expenses: Not on file  Food Insecurity:   . Worried About Charity fundraiser in the Last Year: Not on file  . Ran Out of Food in the Last Year: Not on file  Transportation Needs:   . Lack of Transportation (Medical): Not on file  . Lack of Transportation (Non-Medical): Not on file  Physical Activity:   . Days of Exercise per Week: Not on file  . Minutes of Exercise per Session: Not on file  Stress:   . Feeling of Stress : Not on file  Social Connections:   . Frequency of Communication with Friends and Family: Not on file  . Frequency of Social Gatherings with Friends and Family: Not on file  . Attends Religious Services: Not on file  . Active Member of Clubs or Organizations: Not on file  . Attends Archivist Meetings: Not on file  . Marital Status: Not on file   No family history on file. No Known  Allergies Prior to Admission medications   Medication Sig Start Date End Date Taking? Authorizing Provider  rosuvastatin (CRESTOR) 10 MG tablet Take 10 mg by mouth daily.   Yes [provider]     Positive ROS: Otherwise negative  All other systems have been reviewed and were otherwise negative with the exception of those mentioned in the HPI and as above.  Physical Exam: Constitutional: Alert, well-appearing, no acute distress Ears: External ears without lesions or tenderness. Ear canals moderate mount of cerumen in both ear canals with cerumen adjacent to the right TM.  This was cleaned with suction.  TMs were otherwise clear.. Nasal: External nose without lesions. Clear nasal passages Oral: Oropharynx clear. Neck: No palpable adenopathy or masses Respiratory: Breathing comfortably  Skin: No facial/neck lesions or rash noted.  Cerumen impaction removal  Date/Time: 06/20/2019 3:54 PM Performed by: Rozetta Nunnery, MD Authorized by: Rozetta Nunnery, MD   Consent:    Consent obtained:  Verbal   Consent given by:  Patient   Risks discussed:  Pain and bleeding Procedure details:    Location:  L ear and R ear   Procedure type: suction   Post-procedure details:    Inspection:  TM intact and canal normal   Hearing quality:  Improved  Patient tolerance of procedure:  Tolerated well, no immediate complications Comments:     TMs clear bilaterally.    Assessment: Cerumen impaction  Plan: He will follow-up as needed  [name], MD

## 2019-07-13 DIAGNOSIS — D2261 Melanocytic nevi of right upper limb, including shoulder: Secondary | ICD-10-CM | POA: Diagnosis not present

## 2019-07-13 DIAGNOSIS — D225 Melanocytic nevi of trunk: Secondary | ICD-10-CM | POA: Diagnosis not present

## 2019-07-13 DIAGNOSIS — D3617 Benign neoplasm of peripheral nerves and autonomic nervous system of trunk, unspecified: Secondary | ICD-10-CM | POA: Diagnosis not present

## 2019-07-13 DIAGNOSIS — D2372 Other benign neoplasm of skin of left lower limb, including hip: Secondary | ICD-10-CM | POA: Diagnosis not present

## 2019-07-13 DIAGNOSIS — D1801 Hemangioma of skin and subcutaneous tissue: Secondary | ICD-10-CM | POA: Diagnosis not present

## 2019-07-13 DIAGNOSIS — L812 Freckles: Secondary | ICD-10-CM | POA: Diagnosis not present

## 2019-07-13 DIAGNOSIS — L821 Other seborrheic keratosis: Secondary | ICD-10-CM | POA: Diagnosis not present

## 2019-07-15 DIAGNOSIS — S92512A Displaced fracture of proximal phalanx of left lesser toe(s), initial encounter for closed fracture: Secondary | ICD-10-CM | POA: Diagnosis not present

## 2019-07-15 DIAGNOSIS — M79675 Pain in left toe(s): Secondary | ICD-10-CM | POA: Diagnosis not present

## 2019-09-20 ENCOUNTER — Other Ambulatory Visit: Payer: Self-pay

## 2019-09-20 ENCOUNTER — Ambulatory Visit (INDEPENDENT_AMBULATORY_CARE_PROVIDER_SITE_OTHER): Payer: PPO | Admitting: Otolaryngology

## 2019-09-20 VITALS — Temp 98.1°F

## 2019-09-20 DIAGNOSIS — H6123 Impacted cerumen, bilateral: Secondary | ICD-10-CM | POA: Diagnosis not present

## 2019-09-20 NOTE — Progress Notes (Signed)
HPI: Raymond Ferguson is a 64 y.o. male who presents for evaluation of wax buildup in his ears..  Past Medical History:  Diagnosis Date  . Elbow fracture    Past Surgical History:  Procedure Laterality Date  . ORIF ELBOW FRACTURE Left 02/12/2016   Procedure: OPEN REDUCTION INTERNAL FIXATION (ORIF) ELBOW/OLECRANON FRACTURE, LEFT;  Surgeon: Leanora Cover, MD;  Location: [facility];  Service: Orthopedics;  Laterality: Left;  . TONSILLECTOMY     Social History   Socioeconomic History  . Marital status: Married    Spouse name: Not on file  . Number of children: Not on file  . Years of education: Not on file  . Highest education level: Not on file  Occupational History  . Not on file  Tobacco Use  . Smoking status: Never Smoker  . Smokeless tobacco: Never Used  Substance and Sexual Activity  . Alcohol use: Yes  . Drug use: No  . Sexual activity: Not on file  Other Topics Concern  . Not on file  Social History Narrative  . Not on file   Social Determinants of Health   Financial Resource Strain:   . Difficulty of Paying Living Expenses:   Food Insecurity:   . Worried About Charity fundraiser in the Last Year:   . Arboriculturist in the Last Year:   Transportation Needs:   . Film/video editor (Medical):   Marland Kitchen Lack of Transportation (Non-Medical):   Physical Activity:   . Days of Exercise per Week:   . Minutes of Exercise per Session:   Stress:   . Feeling of Stress :   Social Connections:   . Frequency of Communication with Friends and Family:   . Frequency of Social Gatherings with Friends and Family:   . Attends Religious Services:   . Active Member of Clubs or Organizations:   . Attends Archivist Meetings:   Marland Kitchen Marital Status:    No family history on file. No Known Allergies Prior to Admission medications   Medication Sig Start Date End Date Taking? Authorizing Provider  rosuvastatin (CRESTOR) 10 MG tablet Take 10 mg by mouth daily.     [provider]     Positive ROS: Otherwise negative  All other systems have been reviewed and were otherwise negative with the exception of those mentioned in the HPI and as above.  Physical Exam: Constitutional: Alert, well-appearing, no acute distress Ears: External ears without lesions or tenderness. Ear canals he has large amount of wax buildup in both ear canals that was cleaned with suction. TMs were otherwise clear.. Nasal: External nose without lesions. Clear nasal passages Oral: Oropharynx clear. Neck: No palpable adenopathy or masses Respiratory: Breathing comfortably  Skin: No facial/neck lesions or rash noted.  Cerumen impaction removal  Date/Time: 09/20/2019 2:02 PM Performed by: Rozetta Nunnery, MD Authorized by: Rozetta Nunnery, MD   Consent:    Consent obtained:  Verbal   Consent given by:  Patient   Risks discussed:  Pain and bleeding Procedure details:    Location:  L ear and R ear   Procedure type: suction   Post-procedure details:    Inspection:  TM intact and canal normal   Hearing quality:  Improved   Patient tolerance of procedure:  Tolerated well, no immediate complications Comments:     TMs are otherwise clear    Assessment: Bilateral cerumen buildup  Plan: This was cleaned in the office he will follow-up  as needed.  [name], MD

## 2019-10-12 ENCOUNTER — Other Ambulatory Visit: Payer: Self-pay

## 2019-10-12 ENCOUNTER — Ambulatory Visit (INDEPENDENT_AMBULATORY_CARE_PROVIDER_SITE_OTHER): Payer: PPO | Admitting: Otolaryngology

## 2019-10-12 ENCOUNTER — Encounter (INDEPENDENT_AMBULATORY_CARE_PROVIDER_SITE_OTHER): Payer: Self-pay | Admitting: Otolaryngology

## 2019-10-12 VITALS — Temp 97.3°F

## 2019-10-12 DIAGNOSIS — H6123 Impacted cerumen, bilateral: Secondary | ICD-10-CM

## 2019-10-12 NOTE — Progress Notes (Signed)
HPI: Raymond Ferguson is a 64 y.o. male who presents for evaluation of wax buildup in his ears.  He is generally seen every 3 to 4 months.  However he was last cleaned about a month ago but feels like his ears are blocked..  Past Medical History:  Diagnosis Date   Elbow fracture    Past Surgical History:  Procedure Laterality Date   ORIF ELBOW FRACTURE Left 02/12/2016   Procedure: OPEN REDUCTION INTERNAL FIXATION (ORIF) ELBOW/OLECRANON FRACTURE, LEFT;  Surgeon: Raymond Cover, MD;  Location: [facility];  Service: Orthopedics;  Laterality: Left;   TONSILLECTOMY     Social History   Socioeconomic History   Marital status: Married    Spouse name: Not on file   Number of children: Not on file   Years of education: Not on file   Highest education level: Not on file  Occupational History   Not on file  Tobacco Use   Smoking status: Never Smoker   Smokeless tobacco: Never Used  Substance and Sexual Activity   Alcohol use: Yes   Drug use: No   Sexual activity: Not on file  Other Topics Concern   Not on file  Social History Narrative   Not on file   Social Determinants of Health   Financial Resource Strain:    Difficulty of Paying Living Expenses:   Food Insecurity:    Worried About Raymond Ferguson in the Last Year:    Arboriculturist in the Last Year:   Transportation Needs:    Film/video editor (Medical):    Lack of Transportation (Non-Medical):   Physical Activity:    Days of Exercise per Week:    Minutes of Exercise per Session:   Stress:    Feeling of Stress :   Social Connections:    Frequency of Communication with Friends and Family:    Frequency of Social Gatherings with Friends and Family:    Attends Religious Services:    Active Member of Clubs or Organizations:    Attends Archivist Meetings:    Marital Status:    No family history on file. No Known Allergies Prior to Admission medications    Medication Sig Start Date End Date Taking? Authorizing Provider  rosuvastatin (CRESTOR) 10 MG tablet Take 10 mg by mouth daily.    [provider]     Positive ROS: Otherwise negative  All other systems have been reviewed and were otherwise negative with the exception of those mentioned in the HPI and as above.  Physical Exam: Constitutional: Alert, well-appearing, no acute distress Ears: External ears without lesions or tenderness. Ear canals reveal a moderate small amount of wax in the ear canals but he has some wax adjacent to the TMs bilaterally that was cleaned with suction.  Remaining ear canal cleaned with a curette.  TMs are clear bilaterally.. Nasal: External nose without lesions. Clear nasal passages Oral: Oropharynx clear. Neck: No palpable adenopathy or masses Respiratory: Breathing comfortably  Skin: No facial/neck lesions or rash noted.  Cerumen impaction removal  Date/Time: 10/12/2019 8:59 AM Performed by: Raymond Nunnery, MD Authorized by: Raymond Nunnery, MD   Consent:    Consent obtained:  Verbal   Consent given by:  Patient   Risks discussed:  Pain and bleeding Procedure details:    Location:  L ear and R ear   Procedure type: curette and suction   Post-procedure details:    Inspection:  TM intact  and canal normal   Hearing quality:  Improved   Patient tolerance of procedure:  Tolerated well, no immediate complications Comments:     TMs are clear bilaterally.    Assessment: Cerumen buildup  Plan: He will follow-up as needed  Raymond Journey, MD

## 2019-12-19 ENCOUNTER — Other Ambulatory Visit: Payer: Self-pay

## 2019-12-19 ENCOUNTER — Ambulatory Visit (INDEPENDENT_AMBULATORY_CARE_PROVIDER_SITE_OTHER): Payer: PPO | Admitting: Otolaryngology

## 2019-12-19 VITALS — Temp 97.3°F

## 2019-12-19 DIAGNOSIS — H6123 Impacted cerumen, bilateral: Secondary | ICD-10-CM | POA: Diagnosis not present

## 2019-12-19 NOTE — Progress Notes (Signed)
HPI: Raymond Ferguson is a 64 y.o. male who presents for evaluation of ear wax buildup which presently is worse on the left side as he has symptoms in the left ear..  Past Medical History:  Diagnosis Date  . Elbow fracture    Past Surgical History:  Procedure Laterality Date  . ORIF ELBOW FRACTURE Left 02/12/2016   Procedure: OPEN REDUCTION INTERNAL FIXATION (ORIF) ELBOW/OLECRANON FRACTURE, LEFT;  Surgeon: Leanora Cover, MD;  Location: [facility];  Service: Orthopedics;  Laterality: Left;  . TONSILLECTOMY     Social History   Socioeconomic History  . Marital status: Married    Spouse name: Not on file  . Number of children: Not on file  . Years of education: Not on file  . Highest education level: Not on file  Occupational History  . Not on file  Tobacco Use  . Smoking status: Never Smoker  . Smokeless tobacco: Never Used  Substance and Sexual Activity  . Alcohol use: Yes  . Drug use: No  . Sexual activity: Not on file  Other Topics Concern  . Not on file  Social History Narrative  . Not on file   Social Determinants of Health   Financial Resource Strain:   . Difficulty of Paying Living Expenses:   Food Insecurity:   . Worried About Charity fundraiser in the Last Year:   . Arboriculturist in the Last Year:   Transportation Needs:   . Film/video editor (Medical):   Marland Kitchen Lack of Transportation (Non-Medical):   Physical Activity:   . Days of Exercise per Week:   . Minutes of Exercise per Session:   Stress:   . Feeling of Stress :   Social Connections:   . Frequency of Communication with Friends and Family:   . Frequency of Social Gatherings with Friends and Family:   . Attends Religious Services:   . Active Member of Clubs or Organizations:   . Attends Archivist Meetings:   Marland Kitchen Marital Status:    No family history on file. No Known Allergies Prior to Admission medications   Medication Sig Start Date End Date Taking? Authorizing  Provider  rosuvastatin (CRESTOR) 10 MG tablet Take 10 mg by mouth daily.    [provider]     Positive ROS: Otherwise negative  All other systems have been reviewed and were otherwise negative with the exception of those mentioned in the HPI and as above.  Physical Exam: Constitutional: Alert, well-appearing, no acute distress Ears: External ears without lesions or tenderness. Ear canals large amount of wax bilaterally.  On the left side the cerumen is adjacent to the left TM.  This was cleaned with suction and curette.  TMs are otherwise clear.. Nasal: External nose without lesions. Clear nasal passages Oral: Oropharynx clear. Neck: No palpable adenopathy or masses Respiratory: Breathing comfortably  Skin: No facial/neck lesions or rash noted.  Cerumen impaction removal  Date/Time: 12/19/2019 1:30 PM Performed by: Rozetta Nunnery, MD Authorized by: Rozetta Nunnery, MD   Consent:    Consent obtained:  Verbal   Consent given by:  Patient   Risks discussed:  Pain and bleeding Procedure details:    Location:  L ear and R ear   Procedure type: curette and suction   Post-procedure details:    Inspection:  TM intact and canal normal   Hearing quality:  Improved   Patient tolerance of procedure:  Tolerated well, no immediate complications  Comments:     TMs are otherwise clear.    Assessment: Bilateral cerumen impaction  Plan: He will follow-up as needed  [name], MD

## 2019-12-26 ENCOUNTER — Ambulatory Visit (INDEPENDENT_AMBULATORY_CARE_PROVIDER_SITE_OTHER): Payer: PPO | Admitting: Otolaryngology

## 2020-02-17 ENCOUNTER — Ambulatory Visit (INDEPENDENT_AMBULATORY_CARE_PROVIDER_SITE_OTHER): Payer: PPO | Admitting: Otolaryngology

## 2020-02-23 ENCOUNTER — Other Ambulatory Visit: Payer: Self-pay

## 2020-02-23 ENCOUNTER — Ambulatory Visit (INDEPENDENT_AMBULATORY_CARE_PROVIDER_SITE_OTHER): Payer: PPO | Admitting: Otolaryngology

## 2020-02-23 DIAGNOSIS — H6123 Impacted cerumen, bilateral: Secondary | ICD-10-CM

## 2020-02-23 NOTE — Progress Notes (Signed)
HPI: Raymond Ferguson is a 64 y.o. male who presents for evaluation of wax buildup in his ears.  He was last seen little over 2 months ago.  He has had intermittent blockage of his hearing..  Past Medical History:  Diagnosis Date  . Elbow fracture    Past Surgical History:  Procedure Laterality Date  . ORIF ELBOW FRACTURE Left 02/12/2016   Procedure: OPEN REDUCTION INTERNAL FIXATION (ORIF) ELBOW/OLECRANON FRACTURE, LEFT;  Surgeon: Leanora Cover, MD;  Location: [facility];  Service: Orthopedics;  Laterality: Left;  . TONSILLECTOMY     Social History   Socioeconomic History  . Marital status: Married    Spouse name: Not on file  . Number of children: Not on file  . Years of education: Not on file  . Highest education level: Not on file  Occupational History  . Not on file  Tobacco Use  . Smoking status: Never Smoker  . Smokeless tobacco: Never Used  Substance and Sexual Activity  . Alcohol use: Yes  . Drug use: No  . Sexual activity: Not on file  Other Topics Concern  . Not on file  Social History Narrative  . Not on file   Social Determinants of Health   Financial Resource Strain:   . Difficulty of Paying Living Expenses: Not on file  Food Insecurity:   . Worried About Charity fundraiser in the Last Year: Not on file  . Ran Out of Food in the Last Year: Not on file  Transportation Needs:   . Lack of Transportation (Medical): Not on file  . Lack of Transportation (Non-Medical): Not on file  Physical Activity:   . Days of Exercise per Week: Not on file  . Minutes of Exercise per Session: Not on file  Stress:   . Feeling of Stress : Not on file  Social Connections:   . Frequency of Communication with Friends and Family: Not on file  . Frequency of Social Gatherings with Friends and Family: Not on file  . Attends Religious Services: Not on file  . Active Member of Clubs or Organizations: Not on file  . Attends Archivist Meetings: Not on file   . Marital Status: Not on file   No family history on file. No Known Allergies Prior to Admission medications   Medication Sig Start Date End Date Taking? Authorizing Provider  rosuvastatin (CRESTOR) 10 MG tablet Take 10 mg by mouth daily.    [provider]     Positive ROS: Otherwise negative  All other systems have been reviewed and were otherwise negative with the exception of those mentioned in the HPI and as above.  Physical Exam: Constitutional: Alert, well-appearing, no acute distress Ears: External ears without lesions or tenderness. Ear canals large amount of wax buildup in both ear canals that was cleaned with curette and suction.  TMs were clear bilaterally.. Nasal: External nose without lesions. Clear nasal passages Oral: Oropharynx clear. Neck: No palpable adenopathy or masses Respiratory: Breathing comfortably  Skin: No facial/neck lesions or rash noted.  Cerumen impaction removal  Date/Time: 02/23/2020 1:44 PM Performed by: Rozetta Nunnery, MD Authorized by: Rozetta Nunnery, MD   Consent:    Consent obtained:  Verbal   Consent given by:  Patient   Risks discussed:  Pain and bleeding Procedure details:    Location:  L ear and R ear   Procedure type: curette and suction   Post-procedure details:    Inspection:  TM intact and canal normal   Hearing quality:  Improved   Patient tolerance of procedure:  Tolerated well, no immediate complications Comments:     TMs are clear bilaterally.    Assessment: Bilateral cerumen buildup  Plan: This was cleaned in the office.  He will follow-up as needed.  [name], MD

## 2020-04-18 ENCOUNTER — Other Ambulatory Visit: Payer: Self-pay

## 2020-04-18 ENCOUNTER — Ambulatory Visit (INDEPENDENT_AMBULATORY_CARE_PROVIDER_SITE_OTHER): Payer: PPO | Admitting: Otolaryngology

## 2020-04-18 VITALS — Temp 97.3°F

## 2020-04-18 DIAGNOSIS — H6123 Impacted cerumen, bilateral: Secondary | ICD-10-CM | POA: Diagnosis not present

## 2020-04-18 NOTE — Progress Notes (Signed)
HPI: Raymond Ferguson is a 64 y.o. male who presents for evaluation of wax buildup in both ear canals..  Past Medical History:  Diagnosis Date  . Elbow fracture    Past Surgical History:  Procedure Laterality Date  . ORIF ELBOW FRACTURE Left 02/12/2016   Procedure: OPEN REDUCTION INTERNAL FIXATION (ORIF) ELBOW/OLECRANON FRACTURE, LEFT;  Surgeon: Leanora Cover, MD;  Location: [facility];  Service: Orthopedics;  Laterality: Left;  . TONSILLECTOMY     Social History   Socioeconomic History  . Marital status: Married    Spouse name: Not on file  . Number of children: Not on file  . Years of education: Not on file  . Highest education level: Not on file  Occupational History  . Not on file  Tobacco Use  . Smoking status: Never Smoker  . Smokeless tobacco: Never Used  Substance and Sexual Activity  . Alcohol use: Yes  . Drug use: No  . Sexual activity: Not on file  Other Topics Concern  . Not on file  Social History Narrative  . Not on file   Social Determinants of Health   Financial Resource Strain: Not on file  Food Insecurity: Not on file  Transportation Needs: Not on file  Physical Activity: Not on file  Stress: Not on file  Social Connections: Not on file   No family history on file. No Known Allergies Prior to Admission medications   Medication Sig Start Date End Date Taking? Authorizing Provider  rosuvastatin (CRESTOR) 10 MG tablet Take 10 mg by mouth daily.    [provider]     Positive ROS: Otherwise negative  All other systems have been reviewed and were otherwise negative with the exception of those mentioned in the HPI and as above.  Physical Exam: Constitutional: Alert, well-appearing, no acute distress Ears: External ears without lesions or tenderness. Ear canals with a large amount of wax buildup in both ear canals that was cleaned with suction and curettes. TMs were otherwise clear.. Nasal: External nose without lesions.  Clear nasal passages Oral: Oropharynx clear. Neck: No palpable adenopathy or masses Respiratory: Breathing comfortably  Skin: No facial/neck lesions or rash noted.  Cerumen impaction removal  Date/Time: 04/18/2020 10:50 AM Performed by: Rozetta Nunnery, MD Authorized by: Rozetta Nunnery, MD   Consent:    Consent obtained:  Verbal   Consent given by:  Patient   Risks discussed:  Pain and bleeding Procedure details:    Location:  L ear and R ear   Procedure type: curette and suction   Post-procedure details:    Inspection:  TM intact and canal normal   Hearing quality:  Improved   Patient tolerance of procedure:  Tolerated well, no immediate complications Comments:     TMs are clear bilaterally.    Assessment: Bilateral cerumen buildup  Plan: He will follow-up as needed  [name], MD

## 2020-04-24 ENCOUNTER — Ambulatory Visit (INDEPENDENT_AMBULATORY_CARE_PROVIDER_SITE_OTHER): Payer: PPO | Admitting: Otolaryngology

## 2020-06-08 DIAGNOSIS — Z125 Encounter for screening for malignant neoplasm of prostate: Secondary | ICD-10-CM | POA: Diagnosis not present

## 2020-06-08 DIAGNOSIS — Z0000 Encounter for general adult medical examination without abnormal findings: Secondary | ICD-10-CM | POA: Diagnosis not present

## 2020-06-14 DIAGNOSIS — Z8579 Personal history of other malignant neoplasms of lymphoid, hematopoietic and related tissues: Secondary | ICD-10-CM | POA: Diagnosis not present

## 2020-06-14 DIAGNOSIS — Z23 Encounter for immunization: Secondary | ICD-10-CM | POA: Diagnosis not present

## 2020-06-14 DIAGNOSIS — Z0001 Encounter for general adult medical examination with abnormal findings: Secondary | ICD-10-CM | POA: Diagnosis not present

## 2020-06-14 DIAGNOSIS — E7800 Pure hypercholesterolemia, unspecified: Secondary | ICD-10-CM | POA: Diagnosis not present

## 2020-06-14 DIAGNOSIS — Z125 Encounter for screening for malignant neoplasm of prostate: Secondary | ICD-10-CM | POA: Diagnosis not present

## 2020-07-09 DIAGNOSIS — I2584 Coronary atherosclerosis due to calcified coronary lesion: Secondary | ICD-10-CM | POA: Diagnosis not present

## 2020-07-09 DIAGNOSIS — I2510 Atherosclerotic heart disease of native coronary artery without angina pectoris: Secondary | ICD-10-CM | POA: Diagnosis not present

## 2020-07-11 ENCOUNTER — Telehealth: Payer: Self-pay

## 2020-07-11 NOTE — Telephone Encounter (Signed)
NOTES ON FILE FROM [facility] [phone], SENT REFERRAL TO SCHEDULING 

## 2020-07-13 ENCOUNTER — Encounter (INDEPENDENT_AMBULATORY_CARE_PROVIDER_SITE_OTHER): Payer: Self-pay | Admitting: Otolaryngology

## 2020-07-13 ENCOUNTER — Other Ambulatory Visit: Payer: Self-pay

## 2020-07-13 ENCOUNTER — Ambulatory Visit (INDEPENDENT_AMBULATORY_CARE_PROVIDER_SITE_OTHER): Payer: PPO | Admitting: Otolaryngology

## 2020-07-13 VITALS — Temp 97.7°F

## 2020-07-13 DIAGNOSIS — H6123 Impacted cerumen, bilateral: Secondary | ICD-10-CM

## 2020-07-13 NOTE — Progress Notes (Signed)
HPI: Raymond Ferguson is a 65 y.o. male who presents for evaluation of wax buildup in of his ears.  He has had some slight discomfort in his ears..  He was last cleaned in December.  Past Medical History:  Diagnosis Date  . Elbow fracture    Past Surgical History:  Procedure Laterality Date  . ORIF ELBOW FRACTURE Left 02/12/2016   Procedure: OPEN REDUCTION INTERNAL FIXATION (ORIF) ELBOW/OLECRANON FRACTURE, LEFT;  Surgeon: Leanora Cover, MD;  Location: [facility];  Service: Orthopedics;  Laterality: Left;  . TONSILLECTOMY     Social History   Socioeconomic History  . Marital status: Married    Spouse name: Not on file  . Number of children: Not on file  . Years of education: Not on file  . Highest education level: Not on file  Occupational History  . Not on file  Tobacco Use  . Smoking status: Never Smoker  . Smokeless tobacco: Never Used  Substance and Sexual Activity  . Alcohol use: Yes  . Drug use: No  . Sexual activity: Not on file  Other Topics Concern  . Not on file  Social History Narrative  . Not on file   Social Determinants of Health   Financial Resource Strain: Not on file  Food Insecurity: Not on file  Transportation Needs: Not on file  Physical Activity: Not on file  Stress: Not on file  Social Connections: Not on file   No family history on file. No Known Allergies Prior to Admission medications   Medication Sig Start Date End Date Taking? Authorizing Provider  rosuvastatin (CRESTOR) 10 MG tablet Take 10 mg by mouth daily.    [provider]     Positive ROS: Otherwise negative  All other systems have been reviewed and were otherwise negative with the exception of those mentioned in the HPI and as above.  Physical Exam: Constitutional: Alert, well-appearing, no acute distress Ears: External ears without lesions or tenderness. Ear canals with excessive amount of wax in both ear canals extend all way down to the TMs.  This was  cleaned mostly with suction and curettes.  TMs were clear bilaterally.. Nasal: External nose without lesions. Clear nasal passages Oral: Oropharynx clear. Neck: No palpable adenopathy or masses Respiratory: Breathing comfortably  Skin: No facial/neck lesions or rash noted.  Cerumen impaction removal  Date/Time: 07/13/2020 4:48 PM Performed by: Rozetta Nunnery, MD Authorized by: Rozetta Nunnery, MD   Consent:    Consent obtained:  Verbal   Consent given by:  Patient   Risks discussed:  Pain and bleeding Procedure details:    Location:  L ear and R ear   Procedure type: curette and suction   Post-procedure details:    Inspection:  TM intact and canal normal   Hearing quality:  Improved   Patient tolerance of procedure:  Tolerated well, no immediate complications Comments:     TMs are clear bilaterally.    Assessment: Bilateral cerumen impactions  Plan: This was cleaned in the office.  He will follow-up as needed  Raymond Journey, MD

## 2020-07-20 ENCOUNTER — Other Ambulatory Visit: Payer: Self-pay

## 2020-07-20 ENCOUNTER — Ambulatory Visit: Payer: PPO | Admitting: Cardiovascular Disease

## 2020-07-20 ENCOUNTER — Encounter: Payer: Self-pay | Admitting: Cardiovascular Disease

## 2020-07-20 VITALS — BP 124/72 | HR 63 | Ht 71.0 in | Wt 173.6 lb

## 2020-07-20 DIAGNOSIS — I2510 Atherosclerotic heart disease of native coronary artery without angina pectoris: Secondary | ICD-10-CM | POA: Diagnosis not present

## 2020-07-20 DIAGNOSIS — R931 Abnormal findings on diagnostic imaging of heart and coronary circulation: Secondary | ICD-10-CM

## 2020-07-20 DIAGNOSIS — E782 Mixed hyperlipidemia: Secondary | ICD-10-CM

## 2020-07-20 NOTE — Patient Instructions (Signed)

## 2020-07-20 NOTE — Progress Notes (Signed)
Cardiology Office Note:   Date:  07/20/2020  NAME:  Raymond Ferguson    MRN: [id number] DOB:  17-Mar-1956   PCP:  Deland Pretty, MD  Cardiologist:  No primary care provider on file.  Electrophysiologist:  None   Referring MD: Deland Pretty, MD   Chief Complaint  Patient presents with  . Coronary Artery Disease    History of Present Illness:   Raymond Ferguson is a 65 y.o. male with a hx of hyperlipidemia who is being seen today for the evaluation of CAD/elevated calcium score at the request of Dr. Deland Pretty MD. he recently underwent coronary calcium scoring.  He scores a 12 in the 86 percentile.  He denies any chest pain or trouble breathing.  He reports he can walk 2 to 3 miles without limitations.  He was recently placed on Crestor 20 mg daily and Zetia 10 mg daily by his primary care physician.  They have plans to recheck it.  We went over the results of his tests and his goal LDL cholesterol is less than 70 likely around 50.  He has no elevated blood pressure.  He is a never smoker.  He does drink alcohol but in moderation.  No illicit drug use is reported.  He reports that his brother may have had a mild heart attack.  His father had bypass surgery.  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  His EKG in office demonstrates sinus rhythm with no acute ischemic changes or evidence of infarction.  He reports this was all alarming to him.  He does diet well.  He reports no heavy fast food consumption.  He exercises well as mentioned above.  He overall appears to be in good health and his EKG is reassuring.  He has no symptoms concerning for angina.  Problem List 1. CAD -CAC score 812 (86th percentile) 2. HLD -Total cholesterol 203, HDL 61, LDL 126, triglycerides 91  Past Medical History: Past Medical History:  Diagnosis Date  . Elbow fracture   . Hyperlipidemia     Past Surgical History: Past Surgical History:  Procedure Laterality Date  . ORIF ELBOW FRACTURE Left 02/12/2016   Procedure:  OPEN REDUCTION INTERNAL FIXATION (ORIF) ELBOW/OLECRANON FRACTURE, LEFT;  Surgeon: Leanora Cover, MD;  Location: [facility];  Service: Orthopedics;  Laterality: Left;  . TONSILLECTOMY      Current Medications: Current Meds  Medication Sig  . ezetimibe (ZETIA) 10 MG tablet Take 10 mg by mouth daily.  . rosuvastatin (CRESTOR) 20 MG tablet Take 20 mg by mouth daily.     Allergies:    Patient has no known allergies.   Social History: Social History   Socioeconomic History  . Marital status: Married    Spouse name: Not on file  . Number of children: 2  . Years of education: Not on file  . Highest education level: Not on file  Occupational History  . Occupation: Human resources officer  Tobacco Use  . Smoking status: Never Smoker  . Smokeless tobacco: Never Used  Substance and Sexual Activity  . Alcohol use: Yes  . Drug use: No  . Sexual activity: Not on file  Other Topics Concern  . Not on file  Social History Narrative  . Not on file   Social Determinants of Health   Financial Resource Strain: Not on file  Food Insecurity: Not on file  Transportation Needs: Not on file  Physical Activity: Not on file  Stress: Not on file  Social Connections:  Not on file     Family History: The patient's family history includes Emphysema in his mother; Heart disease in his brother and father; Hyperlipidemia in his father.  ROS:   All other ROS reviewed and negative. Pertinent positives noted in the HPI.     EKGs/Labs/Other Studies Reviewed:   The following studies were personally reviewed by me today:  EKG:  EKG is ordered today.  The ekg ordered today demonstrates normal sinus rhythm heart rate 63, no acute ischemic changes or evidence of infarction, and was personally reviewed by me.   Recent Labs: No results found for requested labs within last 8760 hours.   Recent Lipid Panel No results found for: CHOL, TRIG, HDL, CHOLHDL, VLDL, LDLCALC, LDLDIRECT  Physical Exam:    VS:  BP 124/72 (BP Location: Left Arm, Patient Position: Sitting)   Pulse 63   Ht 5\' 11"  (1.803 m)   Wt 173 lb 9.6 oz (78.7 kg)   SpO2 97%   BMI 24.21 kg/m    Wt Readings from Last 3 Encounters:  07/20/20 173 lb 9.6 oz (78.7 kg)  02/12/16 166 lb (75.3 kg)  02/01/16 165 lb (74.8 kg)    General: Well nourished, well developed, in no acute distress Head: Atraumatic, normal size  Eyes: PEERLA, EOMI  Neck: Supple, no JVD Endocrine: No thryomegaly Cardiac: Normal S1, S2; RRR; no murmurs, rubs, or gallops Lungs: Clear to auscultation bilaterally, no wheezing, rhonchi or rales  Abd: Soft, nontender, no hepatomegaly  Ext: No edema, pulses 2+ Musculoskeletal: No deformities, BUE and BLE strength normal and equal Skin: Warm and dry, no rashes   Neuro: Alert and oriented to person, place, time, and situation, CNII-XII grossly intact, no focal deficits  Psych: Normal mood and affect   ASSESSMENT:   Raymond Ferguson is a 65 y.o. male who presents for the following: 1. Coronary artery disease involving native coronary artery of native heart without angina pectoris   2. Agatston coronary artery calcium score greater than 400   3. Mixed hyperlipidemia     PLAN:   1. Coronary artery disease involving native coronary artery of native heart without angina pectoris 2. Agatston coronary artery calcium score greater than 400 3. Mixed hyperlipidemia -Coronary calcium score of a 812 which is 86 percentile.  Most recent LDL 126.  His EKG in office demonstrates sinus rhythm with no acute ischemic changes.  He has no evidence of ischemia or infarction.  He cannot maintain a high level of activity walking up to 3 miles with no symptoms of chest pain or shortness of breath.  I highly doubt he has any obstructive CAD.  The mainstay of treatment is going to be an aspirin and getting his LDL cholesterol less than 70 if not close to 50.  He was recently started on Crestor 20 mg daily and Zetia 10 mg daily.  He  will have his labs rechecked by his primary care physician in the next few weeks.  He will forward me the results.  His blood pressure is well controlled.  We discussed proper diet and 150 minutes of activity weekly.  I see no need for stress test at this time.  He seems to be doing quite well.  He will see [place] yearly.  I will give him recommendations when he notifies me of his repeat lab draws.  Disposition: Return in about 1 year (around 07/20/2021).  Medication Adjustments/Labs and Tests Ordered: Current medicines are reviewed at length with the patient today.  Concerns regarding medicines are outlined above.  Orders Placed This Encounter  Procedures  . EKG 12-Lead   No orders of the defined types were placed in this encounter.   Patient Instructions  Medication Instructions:  The current medical regimen is effective;  continue present plan and medications.  *If you need a refill on your cardiac medications before your next appointment, please call your pharmacy*   Follow-Up: At [facility], you and your health needs are our priority.  As part of our continuing mission to provide you with exceptional heart care, we have created designated Provider Care Teams.  These Care Teams include your primary Cardiologist (physician) and Advanced Practice Providers (APPs -  Physician Assistants and Nurse Practitioners) who all work together to provide you with the care you need, when you need it.  We recommend signing up for the patient portal called "MyChart".  Sign up information is provided on this After Visit Summary.  MyChart is used to connect with patients for Virtual Visits (Telemedicine).  Patients are able to view lab/test results, encounter notes, upcoming appointments, etc.  Non-urgent messages can be sent to your provider as well.   To learn more about what you can do with MyChart, go to [email or web address].    Your next appointment:   12 month(s)  The format for your next  appointment:   In Person  Provider:   Eleonore Chiquito, MD        Signed, Addison Naegeli. Audie Box, MD, [place]  [address] [place], [place] [address] [phone]  07/20/2020 3:28 PM

## 2020-08-07 DIAGNOSIS — E7800 Pure hypercholesterolemia, unspecified: Secondary | ICD-10-CM | POA: Diagnosis not present

## 2020-08-09 DIAGNOSIS — E7800 Pure hypercholesterolemia, unspecified: Secondary | ICD-10-CM | POA: Diagnosis not present

## 2020-08-09 DIAGNOSIS — R7401 Elevation of levels of liver transaminase levels: Secondary | ICD-10-CM | POA: Diagnosis not present

## 2020-08-16 DIAGNOSIS — S63502A Unspecified sprain of left wrist, initial encounter: Secondary | ICD-10-CM | POA: Diagnosis not present

## 2020-08-16 DIAGNOSIS — R748 Abnormal levels of other serum enzymes: Secondary | ICD-10-CM | POA: Diagnosis not present

## 2020-09-04 ENCOUNTER — Ambulatory Visit (INDEPENDENT_AMBULATORY_CARE_PROVIDER_SITE_OTHER): Payer: PPO | Admitting: Otolaryngology

## 2020-09-04 ENCOUNTER — Other Ambulatory Visit: Payer: Self-pay

## 2020-09-04 VITALS — Temp 97.2°F

## 2020-09-04 DIAGNOSIS — H6123 Impacted cerumen, bilateral: Secondary | ICD-10-CM | POA: Diagnosis not present

## 2020-09-04 NOTE — Progress Notes (Signed)
HPI: Raymond Ferguson is a 65 y.o. male who presents for evaluation of wax buildup in his ears worse on the left side..  Past Medical History:  Diagnosis Date  . Elbow fracture   . Hyperlipidemia    Past Surgical History:  Procedure Laterality Date  . ORIF ELBOW FRACTURE Left 02/12/2016   Procedure: OPEN REDUCTION INTERNAL FIXATION (ORIF) ELBOW/OLECRANON FRACTURE, LEFT;  Surgeon: Leanora Cover, MD;  Location: [facility];  Service: Orthopedics;  Laterality: Left;  . TONSILLECTOMY     Social History   Socioeconomic History  . Marital status: Married    Spouse name: Not on file  . Number of children: 2  . Years of education: Not on file  . Highest education level: Not on file  Occupational History  . Occupation: Human resources officer  Tobacco Use  . Smoking status: Never Smoker  . Smokeless tobacco: Never Used  Substance and Sexual Activity  . Alcohol use: Yes  . Drug use: No  . Sexual activity: Not on file  Other Topics Concern  . Not on file  Social History Narrative  . Not on file   Social Determinants of Health   Financial Resource Strain: Not on file  Food Insecurity: Not on file  Transportation Needs: Not on file  Physical Activity: Not on file  Stress: Not on file  Social Connections: Not on file   Family History  Problem Relation Age of Onset  . Emphysema Mother   . Heart disease Father   . Hyperlipidemia Father   . Heart disease Brother    No Known Allergies Prior to Admission medications   Medication Sig Start Date End Date Taking? Authorizing Provider  ezetimibe (ZETIA) 10 MG tablet Take 10 mg by mouth daily. 07/17/20   [provider]  rosuvastatin (CRESTOR) 20 MG tablet Take 20 mg by mouth daily.    [provider]     Positive ROS: Otherwise negative  All other systems have been reviewed and were otherwise negative with the exception of those mentioned in the HPI and as above.  Physical Exam: Constitutional: Alert,  well-appearing, no acute distress Ears: External ears without lesions or tenderness. Ear canals with a large amount of wax in both ear canals.  On the left side this was adjacent to the TM.  This was removed with curettes and suction.  TMs were clear bilaterally.. Nasal: External nose without lesions. Clear nasal passages Oral: Oropharynx clear. Neck: No palpable adenopathy or masses Respiratory: Breathing comfortably  Skin: No facial/neck lesions or rash noted.  Cerumen impaction removal  Date/Time: 09/04/2020 1:15 PM Performed by: Rozetta Nunnery, MD Authorized by: Rozetta Nunnery, MD   Consent:    Consent obtained:  Verbal   Consent given by:  Patient   Risks discussed:  Pain and bleeding Procedure details:    Location:  L ear and R ear   Procedure type: curette and suction   Post-procedure details:    Inspection:  TM intact and canal normal   Hearing quality:  Improved   Patient tolerance of procedure:  Tolerated well, no immediate complications Comments:     TMs are clear bilaterally    Assessment: Bilateral cerumen impactions  Plan: This was cleaned in the office with improved hearing. He will follow-up as needed  [name], MD

## 2020-09-05 DIAGNOSIS — R748 Abnormal levels of other serum enzymes: Secondary | ICD-10-CM | POA: Diagnosis not present

## 2020-09-06 DIAGNOSIS — R7401 Elevation of levels of liver transaminase levels: Secondary | ICD-10-CM | POA: Diagnosis not present

## 2020-09-13 ENCOUNTER — Ambulatory Visit (INDEPENDENT_AMBULATORY_CARE_PROVIDER_SITE_OTHER): Payer: PPO | Admitting: Otolaryngology

## 2020-10-03 ENCOUNTER — Other Ambulatory Visit: Payer: Self-pay

## 2020-10-03 ENCOUNTER — Ambulatory Visit (INDEPENDENT_AMBULATORY_CARE_PROVIDER_SITE_OTHER): Payer: PPO | Admitting: Otolaryngology

## 2020-10-03 VITALS — Temp 97.5°F

## 2020-10-03 DIAGNOSIS — H9201 Otalgia, right ear: Secondary | ICD-10-CM

## 2020-10-03 DIAGNOSIS — H6123 Impacted cerumen, bilateral: Secondary | ICD-10-CM

## 2020-10-03 NOTE — Progress Notes (Signed)
HPI: Raymond Ferguson is a 65 y.o. male who returns today for evaluation of right ear problems.  It started earlier today with pain in the right ear and sensation of fluid in the right ear.  He thought he might have a right ear infection.  Presents here emergently to have his ears checked and cleaned..  Past Medical History:  Diagnosis Date  . Elbow fracture   . Hyperlipidemia    Past Surgical History:  Procedure Laterality Date  . ORIF ELBOW FRACTURE Left 02/12/2016   Procedure: OPEN REDUCTION INTERNAL FIXATION (ORIF) ELBOW/OLECRANON FRACTURE, LEFT;  Surgeon: Leanora Cover, MD;  Location: [facility];  Service: Orthopedics;  Laterality: Left;  . TONSILLECTOMY     Social History   Socioeconomic History  . Marital status: Married    Spouse name: Not on file  . Number of children: 2  . Years of education: Not on file  . Highest education level: Not on file  Occupational History  . Occupation: Human resources officer  Tobacco Use  . Smoking status: Never Smoker  . Smokeless tobacco: Never Used  Substance and Sexual Activity  . Alcohol use: Yes  . Drug use: No  . Sexual activity: Not on file  Other Topics Concern  . Not on file  Social History Narrative  . Not on file   Social Determinants of Health   Financial Resource Strain: Not on file  Food Insecurity: Not on file  Transportation Needs: Not on file  Physical Activity: Not on file  Stress: Not on file  Social Connections: Not on file   Family History  Problem Relation Age of Onset  . Emphysema Mother   . Heart disease Father   . Hyperlipidemia Father   . Heart disease Brother    No Known Allergies Prior to Admission medications   Medication Sig Start Date End Date Taking? Authorizing Provider  ezetimibe (ZETIA) 10 MG tablet Take 10 mg by mouth daily. 07/17/20   [provider]  rosuvastatin (CRESTOR) 20 MG tablet Take 20 mg by mouth daily.    [provider]     Positive ROS: Otherwise  negative  All other systems have been reviewed and were otherwise negative with the exception of those mentioned in the HPI and as above.  Physical Exam: Constitutional: Alert, well-appearing, no acute distress Ears: External ears without lesions or tenderness.  He had wax buildup in both ear canals.  On the right side the wax was adjacent to the TM.  This was cleaned using hydroperoxide and suction.  The TMs are otherwise clear.  There is no inflammatory changes of the ear canals. Nasal: External nose without lesions.. Clear nasal passages Oral: Lips and gums without lesions. Tongue and palate mucosa without lesions. Posterior oropharynx clear. Neck: No palpable adenopathy or masses Respiratory: Breathing comfortably  Skin: No facial/neck lesions or rash noted.  Cerumen impaction removal  Date/Time: 10/03/2020 12:05 PM Performed by: Rozetta Nunnery, MD Authorized by: Rozetta Nunnery, MD   Consent:    Consent obtained:  Verbal   Consent given by:  Patient   Risks discussed:  Pain and bleeding Procedure details:    Location:  L ear and R ear   Procedure type: suction and forceps   Post-procedure details:    Inspection:  TM intact and canal normal   Hearing quality:  Improved   Patient tolerance of procedure:  Tolerated well, no immediate complications Comments:     Ear canals and TMs are  clear bilaterally.  No signs of infection.    Assessment: Right ear discomfort I suspect is secondary to cerumen buildup.  Plan: This was cleaned in the office today. I discussed briefly with him concerning use of alcohol vinegar ear rinses if he has any pain in the ear. He will follow-up as needed.   [name], MD

## 2020-10-05 DIAGNOSIS — B349 Viral infection, unspecified: Secondary | ICD-10-CM | POA: Diagnosis not present

## 2020-10-07 DIAGNOSIS — Z20822 Contact with and (suspected) exposure to covid-19: Secondary | ICD-10-CM | POA: Diagnosis not present

## 2020-10-07 DIAGNOSIS — R519 Headache, unspecified: Secondary | ICD-10-CM | POA: Diagnosis not present

## 2020-10-07 DIAGNOSIS — R509 Fever, unspecified: Secondary | ICD-10-CM | POA: Diagnosis not present

## 2020-10-07 DIAGNOSIS — M792 Neuralgia and neuritis, unspecified: Secondary | ICD-10-CM | POA: Diagnosis not present

## 2020-10-08 DIAGNOSIS — R509 Fever, unspecified: Secondary | ICD-10-CM | POA: Diagnosis not present

## 2020-10-08 DIAGNOSIS — B029 Zoster without complications: Secondary | ICD-10-CM | POA: Diagnosis not present

## 2020-12-04 DIAGNOSIS — L812 Freckles: Secondary | ICD-10-CM | POA: Diagnosis not present

## 2020-12-04 DIAGNOSIS — D3617 Benign neoplasm of peripheral nerves and autonomic nervous system of trunk, unspecified: Secondary | ICD-10-CM | POA: Diagnosis not present

## 2020-12-04 DIAGNOSIS — L821 Other seborrheic keratosis: Secondary | ICD-10-CM | POA: Diagnosis not present

## 2020-12-04 DIAGNOSIS — L308 Other specified dermatitis: Secondary | ICD-10-CM | POA: Diagnosis not present

## 2020-12-04 DIAGNOSIS — D225 Melanocytic nevi of trunk: Secondary | ICD-10-CM | POA: Diagnosis not present

## 2020-12-04 DIAGNOSIS — D1801 Hemangioma of skin and subcutaneous tissue: Secondary | ICD-10-CM | POA: Diagnosis not present

## 2021-02-27 ENCOUNTER — Other Ambulatory Visit: Payer: Self-pay

## 2021-02-27 ENCOUNTER — Ambulatory Visit (INDEPENDENT_AMBULATORY_CARE_PROVIDER_SITE_OTHER): Payer: PPO | Admitting: Otolaryngology

## 2021-02-27 DIAGNOSIS — H6123 Impacted cerumen, bilateral: Secondary | ICD-10-CM

## 2021-02-27 NOTE — Progress Notes (Signed)
HPI: Raymond Ferguson is a 65 y.o. male who returns today for evaluation of wax buildup in his ears..  He was last cleaned about 5 months ago.  He generally has to be cleaned about every 3 to 4 months.  Past Medical History:  Diagnosis Date   Elbow fracture    Hyperlipidemia    Past Surgical History:  Procedure Laterality Date   ORIF ELBOW FRACTURE Left 02/12/2016   Procedure: OPEN REDUCTION INTERNAL FIXATION (ORIF) ELBOW/OLECRANON FRACTURE, LEFT;  Surgeon: Leanora Cover, MD;  Location: [facility];  Service: Orthopedics;  Laterality: Left;   TONSILLECTOMY     Social History   Socioeconomic History   Marital status: Married    Spouse name: Not on file   Number of children: 2   Years of education: Not on file   Highest education level: Not on file  Occupational History   Occupation: Human resources officer  Tobacco Use   Smoking status: Never   Smokeless tobacco: Never  Substance and Sexual Activity   Alcohol use: Yes   Drug use: No   Sexual activity: Not on file  Other Topics Concern   Not on file  Social History Narrative   Not on file   Social Determinants of Health   Financial Resource Strain: Not on file  Food Insecurity: Not on file  Transportation Needs: Not on file  Physical Activity: Not on file  Stress: Not on file  Social Connections: Not on file   Family History  Problem Relation Age of Onset   Emphysema Mother    Heart disease Father    Hyperlipidemia Father    Heart disease Brother    No Known Allergies Prior to Admission medications   Medication Sig Start Date End Date Taking? Authorizing Provider  ezetimibe (ZETIA) 10 MG tablet Take 10 mg by mouth daily. 07/17/20   [provider]  rosuvastatin (CRESTOR) 20 MG tablet Take 20 mg by mouth daily.    [provider]     Positive ROS: Otherwise negative  All other systems have been reviewed and were otherwise negative with the exception of those mentioned in the HPI and as  above.  Physical Exam: Constitutional: Alert, well-appearing, no acute distress Ears: External ears without lesions or tenderness. Ear canals with a large amount of wax buildup in both ear canals that was cleaned with curette forceps and suction.  TMs were clear bilaterally. Nasal: External nose without lesions.. Clear nasal passages Oral: Lips and gums without lesions. Tongue and palate mucosa without lesions. Posterior oropharynx clear. Neck: No palpable adenopathy or masses Respiratory: Breathing comfortably  Skin: No facial/neck lesions or rash noted.  Cerumen impaction removal  Date/Time: 02/27/2021 12:35 PM Performed by: Rozetta Nunnery, MD Authorized by: Rozetta Nunnery, MD   Consent:    Consent obtained:  Verbal   Consent given by:  Patient   Risks discussed:  Pain and bleeding Procedure details:    Location:  L ear and R ear   Procedure type: curette, suction and forceps   Post-procedure details:    Inspection:  TM intact and canal normal   Hearing quality:  Improved   Procedure completion:  Tolerated well, no immediate complications Comments:     TMs are clear bilaterally.  Assessment: Patient with chronic recurring wax buildup  Plan: Ear canals were cleaned in the office today. Reviewed with him concerning follow-up with one of the other ENT practices in the future as I will be retiring at  the end of the month.   [name], MD

## 2021-06-17 DIAGNOSIS — Z125 Encounter for screening for malignant neoplasm of prostate: Secondary | ICD-10-CM | POA: Diagnosis not present

## 2021-06-17 DIAGNOSIS — D509 Iron deficiency anemia, unspecified: Secondary | ICD-10-CM | POA: Diagnosis not present

## 2021-06-17 DIAGNOSIS — E7800 Pure hypercholesterolemia, unspecified: Secondary | ICD-10-CM | POA: Diagnosis not present

## 2021-06-20 DIAGNOSIS — Z1211 Encounter for screening for malignant neoplasm of colon: Secondary | ICD-10-CM | POA: Diagnosis not present

## 2021-06-20 DIAGNOSIS — H6123 Impacted cerumen, bilateral: Secondary | ICD-10-CM | POA: Diagnosis not present

## 2021-06-20 DIAGNOSIS — N4289 Other specified disorders of prostate: Secondary | ICD-10-CM | POA: Diagnosis not present

## 2021-06-20 DIAGNOSIS — M79672 Pain in left foot: Secondary | ICD-10-CM | POA: Diagnosis not present

## 2021-06-20 DIAGNOSIS — E875 Hyperkalemia: Secondary | ICD-10-CM | POA: Diagnosis not present

## 2021-06-20 DIAGNOSIS — L853 Xerosis cutis: Secondary | ICD-10-CM | POA: Diagnosis not present

## 2021-06-20 DIAGNOSIS — R972 Elevated prostate specific antigen [PSA]: Secondary | ICD-10-CM | POA: Diagnosis not present

## 2021-06-20 DIAGNOSIS — Z9289 Personal history of other medical treatment: Secondary | ICD-10-CM | POA: Diagnosis not present

## 2021-06-20 DIAGNOSIS — R7401 Elevation of levels of liver transaminase levels: Secondary | ICD-10-CM | POA: Diagnosis not present

## 2021-06-20 DIAGNOSIS — Z0000 Encounter for general adult medical examination without abnormal findings: Secondary | ICD-10-CM | POA: Diagnosis not present

## 2021-06-20 DIAGNOSIS — Z1159 Encounter for screening for other viral diseases: Secondary | ICD-10-CM | POA: Diagnosis not present

## 2021-06-26 DIAGNOSIS — M21072 Valgus deformity, not elsewhere classified, left ankle: Secondary | ICD-10-CM | POA: Diagnosis not present

## 2021-06-26 DIAGNOSIS — M19071 Primary osteoarthritis, right ankle and foot: Secondary | ICD-10-CM | POA: Diagnosis not present

## 2021-06-26 DIAGNOSIS — M205X1 Other deformities of toe(s) (acquired), right foot: Secondary | ICD-10-CM | POA: Diagnosis not present

## 2021-06-26 DIAGNOSIS — M2021 Hallux rigidus, right foot: Secondary | ICD-10-CM | POA: Diagnosis not present

## 2021-06-26 DIAGNOSIS — M21071 Valgus deformity, not elsewhere classified, right ankle: Secondary | ICD-10-CM | POA: Diagnosis not present

## 2021-06-26 DIAGNOSIS — M205X2 Other deformities of toe(s) (acquired), left foot: Secondary | ICD-10-CM | POA: Diagnosis not present

## 2021-06-26 DIAGNOSIS — M19072 Primary osteoarthritis, left ankle and foot: Secondary | ICD-10-CM | POA: Diagnosis not present

## 2021-07-17 DIAGNOSIS — E875 Hyperkalemia: Secondary | ICD-10-CM | POA: Diagnosis not present

## 2021-07-17 DIAGNOSIS — R7401 Elevation of levels of liver transaminase levels: Secondary | ICD-10-CM | POA: Diagnosis not present

## 2021-07-17 DIAGNOSIS — R972 Elevated prostate specific antigen [PSA]: Secondary | ICD-10-CM | POA: Diagnosis not present

## 2021-09-18 DIAGNOSIS — H6123 Impacted cerumen, bilateral: Secondary | ICD-10-CM | POA: Diagnosis not present

## 2021-09-18 DIAGNOSIS — H938X3 Other specified disorders of ear, bilateral: Secondary | ICD-10-CM | POA: Diagnosis not present

## 2021-10-17 DIAGNOSIS — Z8042 Family history of malignant neoplasm of prostate: Secondary | ICD-10-CM | POA: Diagnosis not present

## 2021-10-17 DIAGNOSIS — R972 Elevated prostate specific antigen [PSA]: Secondary | ICD-10-CM | POA: Diagnosis not present

## 2021-10-17 DIAGNOSIS — N402 Nodular prostate without lower urinary tract symptoms: Secondary | ICD-10-CM | POA: Diagnosis not present

## 2021-10-17 DIAGNOSIS — N4289 Other specified disorders of prostate: Secondary | ICD-10-CM | POA: Diagnosis not present

## 2021-12-04 DIAGNOSIS — L820 Inflamed seborrheic keratosis: Secondary | ICD-10-CM | POA: Diagnosis not present

## 2021-12-04 DIAGNOSIS — D3617 Benign neoplasm of peripheral nerves and autonomic nervous system of trunk, unspecified: Secondary | ICD-10-CM | POA: Diagnosis not present

## 2021-12-04 DIAGNOSIS — L821 Other seborrheic keratosis: Secondary | ICD-10-CM | POA: Diagnosis not present

## 2021-12-04 DIAGNOSIS — D1801 Hemangioma of skin and subcutaneous tissue: Secondary | ICD-10-CM | POA: Diagnosis not present

## 2021-12-04 DIAGNOSIS — L812 Freckles: Secondary | ICD-10-CM | POA: Diagnosis not present

## 2021-12-04 DIAGNOSIS — D2372 Other benign neoplasm of skin of left lower limb, including hip: Secondary | ICD-10-CM | POA: Diagnosis not present

## 2021-12-25 DIAGNOSIS — H6192 Disorder of left external ear, unspecified: Secondary | ICD-10-CM | POA: Diagnosis not present

## 2021-12-25 DIAGNOSIS — H6123 Impacted cerumen, bilateral: Secondary | ICD-10-CM | POA: Diagnosis not present

## 2022-02-07 DIAGNOSIS — H6123 Impacted cerumen, bilateral: Secondary | ICD-10-CM | POA: Diagnosis not present

## 2022-06-19 DIAGNOSIS — Z125 Encounter for screening for malignant neoplasm of prostate: Secondary | ICD-10-CM | POA: Diagnosis not present

## 2022-06-19 DIAGNOSIS — E7800 Pure hypercholesterolemia, unspecified: Secondary | ICD-10-CM | POA: Diagnosis not present

## 2022-06-26 DIAGNOSIS — Z8042 Family history of malignant neoplasm of prostate: Secondary | ICD-10-CM | POA: Diagnosis not present

## 2022-06-26 DIAGNOSIS — I2510 Atherosclerotic heart disease of native coronary artery without angina pectoris: Secondary | ICD-10-CM | POA: Diagnosis not present

## 2022-06-26 DIAGNOSIS — H6123 Impacted cerumen, bilateral: Secondary | ICD-10-CM | POA: Diagnosis not present

## 2022-06-26 DIAGNOSIS — Z0000 Encounter for general adult medical examination without abnormal findings: Secondary | ICD-10-CM | POA: Diagnosis not present

## 2022-09-11 DIAGNOSIS — H00015 Hordeolum externum left lower eyelid: Secondary | ICD-10-CM | POA: Diagnosis not present

## 2022-10-22 DIAGNOSIS — H938X3 Other specified disorders of ear, bilateral: Secondary | ICD-10-CM | POA: Diagnosis not present

## 2022-10-22 DIAGNOSIS — H6123 Impacted cerumen, bilateral: Secondary | ICD-10-CM | POA: Diagnosis not present

## 2023-01-08 DIAGNOSIS — H2513 Age-related nuclear cataract, bilateral: Secondary | ICD-10-CM | POA: Diagnosis not present

## 2023-01-21 DIAGNOSIS — H938X3 Other specified disorders of ear, bilateral: Secondary | ICD-10-CM | POA: Diagnosis not present

## 2023-01-21 DIAGNOSIS — Z8669 Personal history of other diseases of the nervous system and sense organs: Secondary | ICD-10-CM | POA: Diagnosis not present

## 2023-06-16 DIAGNOSIS — K5730 Diverticulosis of large intestine without perforation or abscess without bleeding: Secondary | ICD-10-CM | POA: Diagnosis not present

## 2023-06-16 DIAGNOSIS — Z1211 Encounter for screening for malignant neoplasm of colon: Secondary | ICD-10-CM | POA: Diagnosis not present

## 2023-06-25 DIAGNOSIS — D649 Anemia, unspecified: Secondary | ICD-10-CM | POA: Diagnosis not present

## 2023-06-25 DIAGNOSIS — E7800 Pure hypercholesterolemia, unspecified: Secondary | ICD-10-CM | POA: Diagnosis not present

## 2023-06-25 DIAGNOSIS — Z125 Encounter for screening for malignant neoplasm of prostate: Secondary | ICD-10-CM | POA: Diagnosis not present

## 2023-06-30 DIAGNOSIS — I2510 Atherosclerotic heart disease of native coronary artery without angina pectoris: Secondary | ICD-10-CM | POA: Diagnosis not present

## 2023-06-30 DIAGNOSIS — Z0000 Encounter for general adult medical examination without abnormal findings: Secondary | ICD-10-CM | POA: Diagnosis not present

## 2023-06-30 DIAGNOSIS — E7800 Pure hypercholesterolemia, unspecified: Secondary | ICD-10-CM | POA: Diagnosis not present

## 2023-06-30 DIAGNOSIS — Z8579 Personal history of other malignant neoplasms of lymphoid, hematopoietic and related tissues: Secondary | ICD-10-CM | POA: Diagnosis not present

## 2023-09-20 DIAGNOSIS — H00015 Hordeolum externum left lower eyelid: Secondary | ICD-10-CM | POA: Diagnosis not present

## 2023-10-28 DIAGNOSIS — E7800 Pure hypercholesterolemia, unspecified: Secondary | ICD-10-CM | POA: Diagnosis not present

## 2023-11-10 DIAGNOSIS — H6123 Impacted cerumen, bilateral: Secondary | ICD-10-CM | POA: Diagnosis not present

## 2023-11-10 DIAGNOSIS — H938X1 Other specified disorders of right ear: Secondary | ICD-10-CM | POA: Diagnosis not present

## 2023-12-29 DIAGNOSIS — E7800 Pure hypercholesterolemia, unspecified: Secondary | ICD-10-CM | POA: Diagnosis not present
